# Patient Record
Sex: Female | Born: 1940 | Race: Black or African American | Hispanic: No | Smoking: Never smoker
Health system: Southern US, Community
[De-identification: ages and names within clinical notes are randomized; demographics above are authoritative.]

## PROBLEM LIST (undated history)

## (undated) DIAGNOSIS — T7840XA Allergy, unspecified, initial encounter: Secondary | ICD-10-CM

## (undated) DIAGNOSIS — F419 Anxiety disorder, unspecified: Secondary | ICD-10-CM

## (undated) DIAGNOSIS — N183 Chronic kidney disease, stage 3 unspecified: Secondary | ICD-10-CM

## (undated) DIAGNOSIS — I10 Essential (primary) hypertension: Secondary | ICD-10-CM

## (undated) DIAGNOSIS — R011 Cardiac murmur, unspecified: Secondary | ICD-10-CM

## (undated) DIAGNOSIS — Z86010 Personal history of colonic polyps: Secondary | ICD-10-CM

## (undated) DIAGNOSIS — E7800 Pure hypercholesterolemia, unspecified: Secondary | ICD-10-CM

## (undated) DIAGNOSIS — G709 Myoneural disorder, unspecified: Secondary | ICD-10-CM

## (undated) DIAGNOSIS — H269 Unspecified cataract: Secondary | ICD-10-CM

## (undated) DIAGNOSIS — M1990 Unspecified osteoarthritis, unspecified site: Secondary | ICD-10-CM

## (undated) HISTORY — PX: COLONOSCOPY: SHX174

## (undated) HISTORY — DX: Cardiac murmur, unspecified: R01.1

## (undated) HISTORY — DX: Anxiety disorder, unspecified: F41.9

## (undated) HISTORY — PX: ABDOMINAL HYSTERECTOMY: SHX81

## (undated) HISTORY — DX: Unspecified osteoarthritis, unspecified site: M19.90

## (undated) HISTORY — DX: Chronic kidney disease, stage 3 unspecified: N18.30

## (undated) HISTORY — DX: Personal history of colonic polyps: Z86.010

## (undated) HISTORY — DX: Chronic kidney disease, stage 3 (moderate): N18.3

## (undated) HISTORY — DX: Unspecified cataract: H26.9

## (undated) HISTORY — DX: Myoneural disorder, unspecified: G70.9

## (undated) HISTORY — DX: Allergy, unspecified, initial encounter: T78.40XA

---

## 2000-05-06 ENCOUNTER — Other Ambulatory Visit: Admission: RE | Admit: 2000-05-06 | Discharge: 2000-05-06 | Payer: Self-pay | Admitting: Obstetrics and Gynecology

## 2006-09-01 ENCOUNTER — Ambulatory Visit: Payer: Self-pay | Admitting: Internal Medicine

## 2006-09-16 ENCOUNTER — Ambulatory Visit: Payer: Self-pay | Admitting: Internal Medicine

## 2008-09-01 ENCOUNTER — Ambulatory Visit: Payer: Self-pay | Admitting: Family Medicine

## 2008-09-01 DIAGNOSIS — D230 Other benign neoplasm of skin of lip: Secondary | ICD-10-CM

## 2008-09-01 DIAGNOSIS — M810 Age-related osteoporosis without current pathological fracture: Secondary | ICD-10-CM | POA: Insufficient documentation

## 2008-09-01 DIAGNOSIS — I10 Essential (primary) hypertension: Secondary | ICD-10-CM | POA: Insufficient documentation

## 2008-09-01 DIAGNOSIS — E785 Hyperlipidemia, unspecified: Secondary | ICD-10-CM

## 2008-09-02 ENCOUNTER — Encounter: Payer: Self-pay | Admitting: Family Medicine

## 2008-09-05 LAB — CONVERTED CEMR LAB
Total CHOL/HDL Ratio: 5
Triglycerides: 222 mg/dL — ABNORMAL HIGH (ref <150)

## 2008-09-19 ENCOUNTER — Encounter: Payer: Self-pay | Admitting: Family Medicine

## 2008-09-20 ENCOUNTER — Telehealth: Payer: Self-pay | Admitting: Family Medicine

## 2008-09-29 ENCOUNTER — Ambulatory Visit: Payer: Self-pay | Admitting: Family Medicine

## 2008-09-29 ENCOUNTER — Encounter: Payer: Self-pay | Admitting: Family Medicine

## 2008-09-29 ENCOUNTER — Other Ambulatory Visit: Admission: RE | Admit: 2008-09-29 | Discharge: 2008-09-29 | Payer: Self-pay | Admitting: Family Medicine

## 2008-11-04 ENCOUNTER — Encounter: Payer: Self-pay | Admitting: Family Medicine

## 2008-11-07 ENCOUNTER — Encounter: Payer: Self-pay | Admitting: Family Medicine

## 2009-01-02 ENCOUNTER — Ambulatory Visit: Payer: Self-pay | Admitting: Family Medicine

## 2009-01-05 LAB — CONVERTED CEMR LAB
AST: 25 units/L (ref 0–37)
BUN: 14 mg/dL (ref 6–23)
CO2: 21 meq/L (ref 19–32)
Calcium: 9.7 mg/dL (ref 8.4–10.5)
Cholesterol: 250 mg/dL — ABNORMAL HIGH (ref 0–200)
Creatinine, Ser: 1.32 mg/dL — ABNORMAL HIGH (ref 0.40–1.20)
LDL Cholesterol: 160 mg/dL — ABNORMAL HIGH (ref 0–99)
Potassium: 4.2 meq/L (ref 3.5–5.3)
Sodium: 143 meq/L (ref 135–145)
Total CHOL/HDL Ratio: 4.1
Triglycerides: 143 mg/dL (ref <150)
Vit D, 25-Hydroxy: 52 ng/mL (ref 30–89)

## 2009-01-06 ENCOUNTER — Telehealth: Payer: Self-pay | Admitting: Family Medicine

## 2009-01-12 ENCOUNTER — Encounter: Payer: Self-pay | Admitting: Family Medicine

## 2009-03-22 ENCOUNTER — Ambulatory Visit: Payer: Self-pay | Admitting: Family Medicine

## 2009-03-22 DIAGNOSIS — N183 Chronic kidney disease, stage 3 (moderate): Secondary | ICD-10-CM

## 2009-03-22 DIAGNOSIS — L80 Vitiligo: Secondary | ICD-10-CM | POA: Insufficient documentation

## 2009-03-22 DIAGNOSIS — R7301 Impaired fasting glucose: Secondary | ICD-10-CM | POA: Insufficient documentation

## 2009-03-23 LAB — CONVERTED CEMR LAB
BUN: 14 mg/dL (ref 6–23)
Direct LDL: 162 mg/dL — ABNORMAL HIGH
Ferritin: 186 ng/mL (ref 10–291)
Glucose, Bld: 105 mg/dL — ABNORMAL HIGH (ref 70–99)
Iron: 110 ug/dL (ref 42–145)
TIBC: 429 ug/dL (ref 250–470)

## 2009-04-25 ENCOUNTER — Telehealth: Payer: Self-pay | Admitting: Family Medicine

## 2009-07-06 ENCOUNTER — Ambulatory Visit: Payer: Self-pay | Admitting: Family Medicine

## 2009-07-06 DIAGNOSIS — J069 Acute upper respiratory infection, unspecified: Secondary | ICD-10-CM | POA: Insufficient documentation

## 2009-08-25 ENCOUNTER — Telehealth: Payer: Self-pay | Admitting: Family Medicine

## 2009-10-02 ENCOUNTER — Ambulatory Visit: Payer: Self-pay | Admitting: Family Medicine

## 2009-10-02 DIAGNOSIS — R197 Diarrhea, unspecified: Secondary | ICD-10-CM

## 2009-10-03 LAB — CONVERTED CEMR LAB
ALT: 18 units/L (ref 0–35)
AST: 18 units/L (ref 0–37)
BUN: 12 mg/dL (ref 6–23)
Basophils Absolute: 0 10*3/uL (ref 0.0–0.1)
Eosinophils Absolute: 0.1 10*3/uL (ref 0.0–0.7)
LDL Cholesterol: 140 mg/dL — ABNORMAL HIGH (ref 0–99)
Lymphs Abs: 3.2 10*3/uL (ref 0.7–4.0)
Monocytes Absolute: 0.7 10*3/uL (ref 0.1–1.0)
Monocytes Relative: 9 % (ref 3–12)
Neutro Abs: 3.4 10*3/uL (ref 1.7–7.7)
Neutrophils Relative %: 46 % (ref 43–77)
Potassium: 3.8 meq/L (ref 3.5–5.3)
WBC: 7.5 10*3/uL (ref 4.0–10.5)

## 2009-10-21 HISTORY — PX: BREAST BIOPSY: SHX20

## 2009-12-01 ENCOUNTER — Telehealth: Payer: Self-pay | Admitting: Family Medicine

## 2009-12-06 ENCOUNTER — Encounter: Payer: Self-pay | Admitting: Family Medicine

## 2009-12-13 ENCOUNTER — Encounter: Payer: Self-pay | Admitting: Family Medicine

## 2009-12-20 ENCOUNTER — Encounter: Payer: Self-pay | Admitting: Family Medicine

## 2009-12-21 ENCOUNTER — Ambulatory Visit: Payer: Self-pay | Admitting: Family Medicine

## 2009-12-21 DIAGNOSIS — L03317 Cellulitis of buttock: Secondary | ICD-10-CM

## 2009-12-21 DIAGNOSIS — L0231 Cutaneous abscess of buttock: Secondary | ICD-10-CM

## 2010-01-01 ENCOUNTER — Encounter: Payer: Self-pay | Admitting: Family Medicine

## 2010-01-23 ENCOUNTER — Encounter: Payer: Self-pay | Admitting: Family Medicine

## 2010-01-24 ENCOUNTER — Ambulatory Visit (HOSPITAL_BASED_OUTPATIENT_CLINIC_OR_DEPARTMENT_OTHER): Admission: RE | Admit: 2010-01-24 | Discharge: 2010-01-24 | Payer: Self-pay | Admitting: General Surgery

## 2010-02-13 ENCOUNTER — Encounter: Payer: Self-pay | Admitting: Family Medicine

## 2010-02-19 ENCOUNTER — Telehealth: Payer: Self-pay | Admitting: Family Medicine

## 2010-03-09 ENCOUNTER — Telehealth: Payer: Self-pay | Admitting: Family Medicine

## 2010-10-19 ENCOUNTER — Telehealth (INDEPENDENT_AMBULATORY_CARE_PROVIDER_SITE_OTHER): Payer: Self-pay | Admitting: *Deleted

## 2010-11-11 ENCOUNTER — Encounter: Payer: Self-pay | Admitting: Family Medicine

## 2010-11-20 NOTE — Progress Notes (Signed)
Summary: Meds  Phone Note Call from Patient   Caller: Patient Summary of Call: Pt LMOM stating she would like to dc actonel and restart aldronate sodium. Please advise.  Initial call taken by: Payton Spark CMA,  December 01, 2009 4:29 PM  Follow-up for Phone Call        alendronate is listed as an allergy that it causes hives. Follow-up by: Seymour Bars DO,  December 01, 2009 4:53 PM  Additional Follow-up for Phone Call Additional follow up Details #1::        Correction- Pt would like to restart the actonel. Would like 3 month supply sent to right [facility]. Additional Follow-up by: Payton Spark CMA,  December 01, 2009 4:58 PM    New/Updated Medications: ACTONEL 150 MG TABS (RISEDRONATE SODIUM) 1 tab by mouth once a month Prescriptions: ACTONEL 150 MG TABS (RISEDRONATE SODIUM) 1 tab by mouth once a month  #1 x 6   Entered and Authorized by:   Seymour Bars DO   Signed by:   Seymour Bars DO on 12/02/2009   Method used:   [facility]ron[facility] to        [facility]  [address] [phone]* (retail)       [address]       [place], [place]  [address]       Ph: [phone] or [phone]       Fax: [phone]   RxID:   [id number]

## 2010-11-20 NOTE — Op Note (Signed)
Summary: Excisional Biopsy/[facility]  Excisional Biopsy/[facility]   Imported By: Lanelle Bal 02/13/2010 13:51:05  _____________________________________________________________________  External Attachment:    Type:   Image     Comment:   External Document

## 2010-11-20 NOTE — Letter (Signed)
Summary: [facility]   Imported By: Lanelle Bal 03/15/2010 13:02:27  _____________________________________________________________________  External Attachment:    Type:   Image     Comment:   External Document

## 2010-11-20 NOTE — Progress Notes (Signed)
Summary: [facility]  Phone Note Call from Patient   Caller: Patient Summary of Call: Pt states pharmacists suggested replacing fenofibrate w/ genifibrazile and also replacing Lotrel w/ lisinopril so Pt can save more money. Please advise. Initial call taken by: Payton Spark CMA,  Mar 09, 2010 4:21 PM  Follow-up for Phone Call        Gemfibrozil is not the same thing as fenofibrate.  It is not as safe to use. Lisinopril is not the same as lotrel which is a combo drug. Follow-up by: Seymour Bars DO,  Mar 09, 2010 4:37 PM     Appended Document: [facility] Pt aware

## 2010-11-20 NOTE — Progress Notes (Signed)
Summary: Requests generic  Phone Note Call from Patient   Caller: Patient Summary of Call: Pt requests generic = to AZOR and ZETIA. She can no longer afford meds.Please advise. Initial call taken by: Payton Spark CMA,  Feb 19, 2010 1:50 PM  Follow-up for Phone Call        Lotrel replaces AZOR. Lovastatin replaces Zetia.  Call if any problems.   Return to f/u BP and chol in 6 wks. Follow-up by: Seymour Bars DO,  Feb 19, 2010 3:48 PM    New/Updated Medications: LOTREL 10-20 MG CAPS (AMLODIPINE BESY-BENAZEPRIL HCL) 1 tab by mouth daily LOVASTATIN 20 MG TABS (LOVASTATIN) 1 tab by mouth qhs Prescriptions: LOVASTATIN 20 MG TABS (LOVASTATIN) 1 tab by mouth qhs  #30 x 2   Entered and Authorized by:   Seymour Bars DO   Signed by:   Seymour Bars DO on 02/19/2010   Method used:   [facility]        [facility]  [address] [phone]* (retail)       [address]       [place], [place]  [address]       Ph: [phone] or [phone]       Fax: [phone]   RxID:   [id number] LOTREL 10-20 MG CAPS (AMLODIPINE BESY-BENAZEPRIL HCL) 1 tab by mouth daily  #30 x 2   Entered and Authorized by:   Seymour Bars DO   Signed by:   Seymour Bars DO on 02/19/2010   Method used:   [facility]        [facility]  [address] [phone]* (retail)       [address]       [place], [place]  [address]       Ph: [phone] or [phone]       Fax: [phone]   RxID:   [id number]   Appended Document: Requests generic LMOM informing Pt of the above  Appended Document: Requests generic Pt stopped stating that she has tried statin drugs in the past and the cause muscle aches and her to feel aweful. Pt would like another Rx that is not a statin and that will be affordable. Please advise.[name] CMA, Michelle Feb 22, 2010 2:44 PM   I changed her cholesterol medicine to Fenofibrate which is NOT a statin.  Let's repeat FLP and LFTs in 3 mos.  Call if any  problems.  Seymour Bars, D.O.  Appended Document: Requests generic LMOM informing Pt of the above

## 2010-11-20 NOTE — Letter (Signed)
Summary: [facility]   Imported By: Lanelle Bal 01/18/2010 09:03:18  _____________________________________________________________________  External Attachment:    Type:   Image     Comment:   External Document

## 2010-11-20 NOTE — Assessment & Plan Note (Signed)
Summary: abscess   Vital Signs:  Patient profile:   70 year old female Height:      61.5 inches Weight:      138 pounds BMI:     25.75 O2 Sat:      98 % on Room air Temp:     98.2 degrees F oral Pulse rate:   59 / minute BP sitting:   127 / 76  (left arm) Cuff size:   regular  Vitals Entered By: Payton Spark CMA (December 21, 2009 1:46 PM)  O2 Flow:  Room air CC: ? boil inside L buttock x 8 days. No pain. No drainage.    Primary Care Provider:  Seymour Bars DO  CC:  ? boil inside L buttock x 8 days. No pain. No drainage. Marland Kitchen  History of Present Illness: 70 yo AAF presents for a boil on the inside of her L buttock that started 8 days ago.  No fevers.  Minimal tenderness. No bleeding.  Has not felt any drainage.  O/w feels well.  Has some itching.  Current Medications (verified): 1)  Azor 5-20 Mg Tabs (Amlodipine-Olmesartan) .Marland Kitchen.. 1 Tab By Mouth Daily 2)  Multivitamins  Tabs (Multiple Vitamin) .... Take 1 Tablet By Mouth Once A Day 3)  Caltrate 600+d 600-400 Mg-Unit Tabs (Calcium Carbonate-Vitamin D) .... Take 1 Tablet By Mouth Two Times A Day 4)  Zetia 10 Mg Tabs (Ezetimibe) .Marland Kitchen.. 1 Tab By Mouth Daily 5)  D 400  Caps (Vitamins A & D) .... Take 1 Tablet By Mouth Once A Day 6)  Actonel 150 Mg Tabs (Risedronate Sodium) .Marland Kitchen.. 1 Tab By Mouth Once A Month  Allergies (verified): 1)  ! Calan 2)  ! Alendronate Sodium (Alendronate Sodium) 3)  Lovaza (Omega-3-Acid Ethyl Esters)  Past History:  Past Medical History: Reviewed history from 03/22/2009 and no changes required. high cholesterol surgical menopause at 36, was on HRT x 20 yrs. HTN osteopenia (DEXA 2007) G1P1 IFG HTN  Social History: Reviewed history from 09/01/2008 and no changes required. Retired Engineer, civil (consulting). Divorced.  Not in a relationship. Lives alone. Never smoked.  Denies ETOH. Walks/ dances for an hour 3 x a wk. Fair diet.  Has a daughter and 2 grandkids in [place].  Review of Systems      See HPI  Physical  Exam  General:  alert, well-developed, well-nourished, and well-hydrated.   Lungs:  Normal respiratory effort, chest expands symmetrically. Lungs are clear to auscultation, no crackles or wheezes. Heart:  Normal rate and regular rhythm. S1 and S2 normal without gallop, murmur, click, rub or other extra sounds. Skin:  1 cm round open denuded skin in the L natal cleft with clear serosanguinous drainage.  No pus.  No induration.  Erythematous base with no streaking or heat or edema.     Impression & Recommendations:  Problem # 1:  CELLULITIS AND ABSCESS OF BUTTOCK (ICD-682.5) Small open natal cleft abscess.  Treat with Doxy x 7 days. Clean with Dial soap and water and cover with polysporin ointment.  Call if not improving. Her updated medication list for this problem includes:    Doxycycline Hyclate 100 Mg Caps (Doxycycline hyclate) .Marland Kitchen... 1 capsule by mouth two times a day x 7 days  Complete Medication List: 1)  Azor 5-20 Mg Tabs (Amlodipine-olmesartan) .Marland Kitchen.. 1 tab by mouth daily 2)  Multivitamins Tabs (Multiple vitamin) .... Take 1 tablet by mouth once a day 3)  Caltrate 600+d 600-400 Mg-unit Tabs (Calcium carbonate-vitamin d) .... Take  1 tablet by mouth two times a day 4)  Zetia 10 Mg Tabs (Ezetimibe) .Marland Kitchen.. 1 tab by mouth daily 5)  D 400 Caps (Vitamins a & d) .... Take 1 tablet by mouth once a day 6)  Actonel 150 Mg Tabs (Risedronate sodium) .Marland Kitchen.. 1 tab by mouth once a month 7)  Doxycycline Hyclate 100 Mg Caps (Doxycycline hyclate) .Marland Kitchen.. 1 capsule by mouth two times a day x 7 days  Patient Instructions: 1)  Treat open abscess on bottom with 7 days of oral doxycycline (2 x a day with food).  2)  Clean with dial soap and water daily and cover with polysporin ointment.   3)  Call if not improved in 10 days. Prescriptions: DOXYCYCLINE HYCLATE 100 MG CAPS (DOXYCYCLINE HYCLATE) 1 capsule by mouth two times a day x 7 days  #14 x 0   Entered and Authorized by:   Seymour Bars DO   Signed by:   Seymour Bars DO on 12/21/2009   Method used:   [facility]        [facility]  [address] [phone]* (retail)       [address]       [place], [place]  [address]       Ph: [phone] or [phone]       Fax: [phone]   RxID:   [id number] ZETIA 10 MG TABS (EZETIMIBE) 1 tab by mouth daily  #90 x 0   Entered by:   Payton Spark CMA   Authorized by:   Seymour Bars DO   Signed by:   Seymour Bars DO on 12/21/2009   Method used:   [facility]        [facility]  [address] [phone]* (retail)       [address]       [place], [place]  [address]       Ph: [phone] or [phone]       Fax: [phone]   RxID:   [id number]

## 2010-11-22 NOTE — Progress Notes (Signed)
       New/Updated Medications: AMLODIPINE BESYLATE 10 MG TABS (AMLODIPINE BESYLATE) Take 1 tab by mouth once daily BENAZEPRIL HCL 20 MG TABS (BENAZEPRIL HCL) Take 1 tab by mouth once daily Prescriptions: BENAZEPRIL HCL 20 MG TABS (BENAZEPRIL HCL) Take 1 tab by mouth once daily  #30 x 3   Entered by:   Payton Spark CMA   Authorized by:   Seymour Bars DO   Signed by:   Payton Spark CMA on 10/19/2010   Method used:   [facility]        [facility]  [address] [phone]* (retail)       [address]       [place], [place]  [address]       Ph: [phone] or [phone]       Fax: [phone]   RxID:   [id number] AMLODIPINE BESYLATE 10 MG TABS (AMLODIPINE BESYLATE) Take 1 tab by mouth once daily  #30 x 3   Entered by:   Payton Spark CMA   Authorized by:   Seymour Bars DO   Signed by:   Payton Spark CMA on 10/19/2010   Method used:   [facility]        [facility]  [address] [phone]* (retail)       [address]       [place], [place]  [address]       Ph: [phone] or [phone]       Fax: [phone]   RxID:   [id number]

## 2011-01-09 LAB — BASIC METABOLIC PANEL
BUN: 12 mg/dL (ref 6–23)
Chloride: 110 mEq/L (ref 96–112)
Creatinine, Ser: 0.89 mg/dL (ref 0.4–1.2)
Glucose, Bld: 137 mg/dL — ABNORMAL HIGH (ref 70–99)
Potassium: 3.8 mEq/L (ref 3.5–5.1)

## 2011-01-09 LAB — DIFFERENTIAL
Basophils Absolute: 0 10*3/uL (ref 0.0–0.1)
Basophils Relative: 0 % (ref 0–1)
Eosinophils Absolute: 0.1 10*3/uL (ref 0.0–0.7)
Eosinophils Relative: 1 % (ref 0–5)
Neutrophils Relative %: 53 % (ref 43–77)

## 2011-01-09 LAB — CBC
HCT: 43.7 % (ref 36.0–46.0)
MCV: 91.1 fL (ref 78.0–100.0)
Platelets: 182 10*3/uL (ref 150–400)
RDW: 13.9 % (ref 11.5–15.5)

## 2011-01-09 LAB — POCT HEMOGLOBIN-HEMACUE: Hemoglobin: 15.4 g/dL — ABNORMAL HIGH (ref 12.0–15.0)

## 2011-02-18 ENCOUNTER — Other Ambulatory Visit: Payer: Self-pay | Admitting: Family Medicine

## 2011-03-05 ENCOUNTER — Other Ambulatory Visit: Payer: Self-pay | Admitting: Family Medicine

## 2012-02-29 ENCOUNTER — Emergency Department (HOSPITAL_BASED_OUTPATIENT_CLINIC_OR_DEPARTMENT_OTHER)
Admission: EM | Admit: 2012-02-29 | Discharge: 2012-02-29 | Disposition: A | Discharge status: Home / Self Care | Payer: Medicare Other | Attending: Emergency Medicine | Admitting: Emergency Medicine

## 2012-02-29 ENCOUNTER — Encounter (HOSPITAL_BASED_OUTPATIENT_CLINIC_OR_DEPARTMENT_OTHER): Payer: Self-pay | Admitting: *Deleted

## 2012-02-29 DIAGNOSIS — R197 Diarrhea, unspecified: Secondary | ICD-10-CM | POA: Insufficient documentation

## 2012-02-29 DIAGNOSIS — Z79899 Other long term (current) drug therapy: Secondary | ICD-10-CM | POA: Insufficient documentation

## 2012-02-29 DIAGNOSIS — I10 Essential (primary) hypertension: Secondary | ICD-10-CM | POA: Insufficient documentation

## 2012-02-29 DIAGNOSIS — E7800 Pure hypercholesterolemia, unspecified: Secondary | ICD-10-CM | POA: Insufficient documentation

## 2012-02-29 DIAGNOSIS — M810 Age-related osteoporosis without current pathological fracture: Secondary | ICD-10-CM | POA: Insufficient documentation

## 2012-02-29 DIAGNOSIS — R109 Unspecified abdominal pain: Secondary | ICD-10-CM | POA: Insufficient documentation

## 2012-02-29 HISTORY — DX: Pure hypercholesterolemia, unspecified: E78.00

## 2012-02-29 HISTORY — DX: Essential (primary) hypertension: I10

## 2012-02-29 LAB — CBC
MCH: 30.1 pg (ref 26.0–34.0)
MCHC: 34.6 g/dL (ref 30.0–36.0)
MCV: 86.8 fL (ref 78.0–100.0)
Platelets: 178 10*3/uL (ref 150–400)
RBC: 4.79 MIL/uL (ref 3.87–5.11)
RDW: 13.4 % (ref 11.5–15.5)

## 2012-02-29 LAB — URINALYSIS, ROUTINE W REFLEX MICROSCOPIC
Bilirubin Urine: NEGATIVE
Ketones, ur: NEGATIVE mg/dL
Nitrite: NEGATIVE
Protein, ur: NEGATIVE mg/dL
Urobilinogen, UA: 0.2 mg/dL (ref 0.0–1.0)

## 2012-02-29 LAB — DIFFERENTIAL
Eosinophils Absolute: 0.1 10*3/uL (ref 0.0–0.7)
Eosinophils Relative: 1 % (ref 0–5)
Lymphs Abs: 3.8 10*3/uL (ref 0.7–4.0)

## 2012-02-29 LAB — COMPREHENSIVE METABOLIC PANEL
ALT: 28 U/L (ref 0–35)
BUN: 10 mg/dL (ref 6–23)
Calcium: 9.7 mg/dL (ref 8.4–10.5)
GFR calc Af Amer: 74 mL/min — ABNORMAL LOW (ref >90)
Glucose, Bld: 103 mg/dL — ABNORMAL HIGH (ref 70–99)
Sodium: 139 mEq/L (ref 135–145)
Total Protein: 7.4 g/dL (ref 6.0–8.3)

## 2012-02-29 LAB — LIPASE, BLOOD: Lipase: 22 U/L (ref 11–59)

## 2012-02-29 MED ORDER — CIPROFLOXACIN HCL 500 MG PO TABS
500.0000 mg | ORAL_TABLET | Freq: Once | ORAL | Status: AC
  Administered 2012-02-29: 500 mg via ORAL
  Filled 2012-02-29: qty 1

## 2012-02-29 MED ORDER — METRONIDAZOLE 500 MG PO TABS
500.0000 mg | ORAL_TABLET | Freq: Once | ORAL | Status: AC
  Administered 2012-02-29: 500 mg via ORAL
  Filled 2012-02-29: qty 1

## 2012-02-29 MED ORDER — ONDANSETRON HCL 4 MG/2ML IJ SOLN
4.0000 mg | Freq: Once | INTRAMUSCULAR | Status: AC
  Administered 2012-02-29: 4 mg via INTRAVENOUS
  Filled 2012-02-29: qty 2

## 2012-02-29 MED ORDER — METRONIDAZOLE 500 MG PO TABS: 500.0000 mg | ORAL_TABLET | Freq: Two times a day (BID) | ORAL | Status: AC

## 2012-02-29 MED ORDER — SODIUM CHLORIDE 0.9 % IV BOLUS (SEPSIS)
1000.0000 mL | Freq: Once | INTRAVENOUS | Status: AC
  Administered 2012-02-29: 1000 mL via INTRAVENOUS

## 2012-02-29 MED ORDER — CIPROFLOXACIN HCL 500 MG PO TABS: 500.0000 mg | ORAL_TABLET | Freq: Two times a day (BID) | ORAL | Status: AC

## 2012-02-29 NOTE — ED Provider Notes (Signed)
History   This chart was scribed for Linda Quarry, MD by Shari Heritage. The patient was seen in room MH12/MH12. Patient's care was started at 1343.   CSN: [id number]  Arrival date & time 02/29/12  1343   First MD Initiated Contact with Patient 02/29/12 [phone]      Chief Complaint  Patient presents with  . Rectal Bleeding    (Consider location/radiation/quality/duration/timing/severity/associated sxs/prior treatment) The history is provided by the patient. No language interpreter was used.   Linda Werner is a 71 y.o. femalea who presents to the Emergency Department complaining of persistent hematochezia onset yesterday evening associated with lower abdominal cramps, nausea, diarrhea, and vomiting. Patient reports having 6 bowel movements. Patient describes the blood as bright red. Patient reports that there was also a mucous-like substance in her stool. Patient claims she had one episode of emesis. Patient experienced abdominal cramps all afternoon yesterday and felt nauseated all last night. Patient reports that she feels uncomfortable, but she is not currently in pain. Patient reports that she ate oatmeal earlier today and can keep fluids down. Patient denies history of diverticulitis or stomach bleeding. Patien't doesn't take Aspirin regularly. Patient denies chills, HA, chest pain, back pain, cough and congestion. Patient with h/o of HTN, hypercholesteremia, and osteoporosis.  PCP - Dr. Glory Rosebush at [facility] Past Medical History  Diagnosis Date  . Hypertension   . Hypercholesteremia   . Osteoporosis     Past Surgical History  Procedure Date  . Abdominal hysterectomy     History reviewed. No pertinent family history.  History  Substance Use Topics  . Smoking status: Never Smoker   . Smokeless tobacco: Not on file  . Alcohol Use: No    OB History    Grav Para Term Preterm Abortions TAB SAB Ect Mult Living                  Review of Systems  Gastrointestinal:  Positive for nausea, vomiting, abdominal pain (Describes pain as cramps), diarrhea (6 bowel movements) and blood in stool.  All other systems reviewed and are negative.     Allergies  Alendronate sodium; Statins; and Verapamil hcl  Home Medications   Current Outpatient Rx  Name Route Sig Dispense Refill  . CALCIUM CARBONATE 600 MG PO TABS Oral Take 600 mg by mouth 2 (two) times daily with a meal.    . EZETIMIBE 10 MG PO TABS Oral Take 10 mg by mouth daily.    . CENTRUM SILVER PO Oral Take 1 tablet by mouth once.    Marland Kitchen RISEDRONATE SODIUM 150 MG PO TABS Oral Take 150 mg by mouth every 30 (thirty) days. with water on empty stomach, nothing by mouth or lie down for next 30 minutes.    Marland Kitchen VITAMIN D (CHOLECALCIFEROL) 400 UNITS PO CHEW Oral Chew by mouth.    . AMLODIPINE BESYLATE 10 MG PO TABS  TAKE 1 TAB BY MOUTH ONCE DAILY 30 tablet 3  . BENAZEPRIL HCL 20 MG PO TABS  TAKE 1 TAB BY MOUTH ONCE DAILY 30 tablet 3    BP 137/83  Pulse 100  Temp(Src) 98.5 F (36.9 C) (Oral)  Resp 20  Ht 5\' 2"  (1.575 m)  Wt 142 lb (64.411 kg)  BMI 25.97 kg/m2  SpO2 98%  Physical Exam  Nursing note and vitals reviewed. Constitutional: She is oriented to person, place, and time. She appears well-developed and well-nourished.  HENT:  Head: Normocephalic and atraumatic.  Eyes: Conjunctivae and EOM are  normal. Pupils are equal, round, and reactive to light.  Neck: Normal range of motion. Neck supple.  Cardiovascular: Normal rate and regular rhythm.   Pulmonary/Chest: Effort normal and breath sounds normal.  Abdominal: Soft. Bowel sounds are normal.       Reddish mucous in stool  Musculoskeletal: Normal range of motion.  Neurological: She is alert and oriented to person, place, and time.  Skin: Skin is warm and dry.  Psychiatric: She has a normal mood and affect.    ED Course  Procedures (including critical care time) DIAGNOSTIC STUDIES: Oxygen Saturation is 98% on room air, normal by my  interpretation.    COORDINATION OF CARE: 3:21PM- Patient informed of current plan for treatment and evaluation and agrees with plan at this time. Will order labs including CBC.     Labs Reviewed  URINALYSIS, ROUTINE W REFLEX MICROSCOPIC  OCCULT BLOOD X 1 CARD TO LAB, STOOL   No results found.   No diagnosis found.    MDM  Patient with symptoms c.w. Gastroenteritis- No fever here and no abdominal pain.  Patient with stool with mucous and blood.  Plan cipro and flagyl and advised follow up with md on Monday or return if symptoms worsen.     Linda Quarry, MD 02/29/12 [phone]

## 2012-02-29 NOTE — ED Notes (Signed)
Pt states this has happened a couple of times before, but last night she noticed bright red blood in mucousy stool. Nrl colonoscopy 5 yrs ago. "Uncomfortable feeling in abd"

## 2012-02-29 NOTE — Discharge Instructions (Signed)
Bloody Diarrhea Bloody diarrhea can be caused by many different conditions. Most of the time bloody diarrhea is the result of food poisoning or minor infections. Bloody diarrhea usually improves over 2 to 3 days of rest and fluid replacement. Other conditions that can cause bloody diarrhea include:  Internal bleeding.   Infection.   Diseases of the bowel and colon.  Internal bleeding from an ulcer or bowel disease can be severe and requires hospital care or even surgery. DIAGNOSIS  To find out what is wrong your caregiver may check your:  Stool.   Blood.   Results from a test that looks inside the body (endoscopy).  TREATMENT   Get plenty of rest.   Drink enough water and fluids to keep your urine clear or pale yellow.   Do not smoke.   Solid foods and dairy products should be avoided until your illness improves.   As you improve, slowly return to a regular diet with easily-digested foods first. Examples are:   Bananas.   Rice.   Toast.   Crackers.  You should only need these for about 2 days before adding more normal foods to your diet.  Avoid spicy or fatty foods as well as caffeine and alcohol for several days.   Medicine to control cramping and diarrhea can relieve symptoms but may prolong some cases of bloody diarrhea. Antibiotics can speed recovery from diarrhea due to some bacterial infections. Call your caregiver if diarrhea does not get better in 3 days.  SEEK MEDICAL CARE IF:   You do not improve after 3 days.   Your diarrhea improves but your stool appears black.  SEEK IMMEDIATE MEDICAL CARE IF:   You become extremely weak or faint.   You become very sweaty.   You have increased pain or bleeding.   You develop repeated vomiting.   You vomit and you see blood or the vomit looks black in color.   You have a fever.  Document Released: 10/07/2005 Document Revised: 09/26/2011 Document Reviewed: 09/08/2009 [facility] Patient Information 2012 [place],  [place].  Recheck with your doctor Monday.  Return if worse especially increased bleeding or pain or weakness.

## 2012-09-02 ENCOUNTER — Encounter: Payer: Self-pay | Admitting: Family Medicine

## 2012-09-02 ENCOUNTER — Ambulatory Visit (INDEPENDENT_AMBULATORY_CARE_PROVIDER_SITE_OTHER): Payer: Medicare Other | Admitting: Family Medicine

## 2012-09-02 VITALS — BP 132/72 | HR 85 | Ht 61.0 in | Wt 148.0 lb

## 2012-09-02 DIAGNOSIS — Z0000 Encounter for general adult medical examination without abnormal findings: Secondary | ICD-10-CM

## 2012-09-02 DIAGNOSIS — M899 Disorder of bone, unspecified: Secondary | ICD-10-CM

## 2012-09-02 DIAGNOSIS — Z23 Encounter for immunization: Secondary | ICD-10-CM

## 2012-09-02 DIAGNOSIS — H259 Unspecified age-related cataract: Secondary | ICD-10-CM

## 2012-09-02 DIAGNOSIS — M8580 Other specified disorders of bone density and structure, unspecified site: Secondary | ICD-10-CM

## 2012-09-02 DIAGNOSIS — E785 Hyperlipidemia, unspecified: Secondary | ICD-10-CM

## 2012-09-02 DIAGNOSIS — Z1231 Encounter for screening mammogram for malignant neoplasm of breast: Secondary | ICD-10-CM

## 2012-09-02 LAB — COMPLETE METABOLIC PANEL WITH GFR
ALT: 22 U/L (ref 0–35)
AST: 24 U/L (ref 0–37)
Albumin: 4.8 g/dL (ref 3.5–5.2)
Calcium: 10.3 mg/dL (ref 8.4–10.5)
Chloride: 104 mEq/L (ref 96–112)
Potassium: 3.6 mEq/L (ref 3.5–5.3)
Total Protein: 7.2 g/dL (ref 6.0–8.3)

## 2012-09-02 LAB — LIPID PANEL
LDL Cholesterol: 154 mg/dL — ABNORMAL HIGH (ref 0–99)
VLDL: 39 mg/dL (ref 0–40)

## 2012-09-02 MED ORDER — RISEDRONATE SODIUM 150 MG PO TABS: 150.0000 mg | ORAL_TABLET | ORAL | Status: DC

## 2012-09-02 MED ORDER — EZETIMIBE 10 MG PO TABS: 10.0000 mg | ORAL_TABLET | Freq: Every day | ORAL | Status: DC

## 2012-09-02 NOTE — Patient Instructions (Signed)
Keep up a regular exercise program and make sure you are eating a healthy diet Try to eat 4 servings of dairy a day, or if you are lactose intolerant take a calcium with vitamin D daily.  Your vaccines are up to date.   

## 2012-09-02 NOTE — Addendum Note (Signed)
Addended by: Judie Petit A on: 09/02/2012 10:13 AM   Modules accepted: Orders

## 2012-09-02 NOTE — Progress Notes (Signed)
Subjective:    Linda Werner is a 71 y.o. female who presents for Medicare Annual/Subsequent preventive examination.  Preventive Screening-Counseling & Management  Tobacco History  Smoking status  . Never Smoker   Smokeless tobacco  . Not on file     Problems Prior to Visit 1.   Current Problems (verified) Patient Active Problem List  Diagnosis  . BENIGN NEOPLASM OF SKIN OF LIP  . HYPERLIPIDEMIA  . ESSENTIAL HYPERTENSION, BENIGN  . URI  . RENAL DISEASE, CHRONIC, MILD  . CELLULITIS AND ABSCESS OF BUTTOCK  . VITILIGO  . OSTEOPENIA  . DIARRHEA  . IMPAIRED FASTING GLUCOSE    Medications Prior to Visit Current Outpatient Prescriptions on File Prior to Visit  Medication Sig Dispense Refill  . amLODipine (NORVASC) 10 MG tablet TAKE 1 TAB BY MOUTH ONCE DAILY  30 tablet  3  . benazepril (LOTENSIN) 20 MG tablet TAKE 1 TAB BY MOUTH ONCE DAILY  30 tablet  3  . calcium carbonate (OS-CAL) 600 MG TABS Take 600 mg by mouth 2 (two) times daily with a meal.      . ezetimibe (ZETIA) 10 MG tablet Take 10 mg by mouth daily.      . Multiple Vitamins-Minerals (CENTRUM SILVER PO) Take 1 tablet by mouth once.      . risedronate (ACTONEL) 150 MG tablet Take 150 mg by mouth every 30 (thirty) days. with water on empty stomach, nothing by mouth or lie down for next 30 minutes.      . Vitamin D, Cholecalciferol, 400 UNITS CHEW Chew by mouth.        Current Medications (verified) Current Outpatient Prescriptions  Medication Sig Dispense Refill  . amLODipine (NORVASC) 10 MG tablet TAKE 1 TAB BY MOUTH ONCE DAILY  30 tablet  3  . benazepril (LOTENSIN) 20 MG tablet TAKE 1 TAB BY MOUTH ONCE DAILY  30 tablet  3  . calcium carbonate (OS-CAL) 600 MG TABS Take 600 mg by mouth 2 (two) times daily with a meal.      . ezetimibe (ZETIA) 10 MG tablet Take 10 mg by mouth daily.      . Multiple Vitamins-Minerals (CENTRUM SILVER PO) Take 1 tablet by mouth once.      . risedronate (ACTONEL) 150 MG tablet  Take 150 mg by mouth every 30 (thirty) days. with water on empty stomach, nothing by mouth or lie down for next 30 minutes.      . Vitamin D, Cholecalciferol, 400 UNITS CHEW Chew by mouth.         Allergies (verified) Alendronate sodium; Statins; and Verapamil hcl   PAST HISTORY  Family History Family History  Problem Relation Age of Onset  . Hypertension      Social History History  Substance Use Topics  . Smoking status: Never Smoker   . Smokeless tobacco: Not on file  . Alcohol Use: No     Are there smokers in your home (other than you)? No  Risk Factors Current exercise habits: daily exercise  Dietary issues discussed: none   Cardiac risk factors: none.  Depression Screen (Note: if answer to either of the following is "Yes", a more complete depression screening is indicated)   Over the past two weeks, have you felt down, depressed or hopeless? No  Over the past two weeks, have you felt little interest or pleasure in doing things? No  Have you lost interest or pleasure in daily life? No  Do you often feel hopeless? No  Do you cry easily over simple problems? No  Activities of Daily Living In your present state of health, do you have any difficulty performing the following activities?:  Driving? No Managing money?  No Feeding yourself? No Getting from bed to chair? No Climbing a flight of stairs? No Preparing food and eating?: No Bathing or showering? No Getting dressed: No Getting to the toilet? No Using the toilet:No Moving around from place to place: No In the past year have you fallen or had a near fall?:No   Are you sexually active?  No  Do you have more than one partner?  No  Hearing Difficulties: No Do you often ask people to speak up or repeat themselves? No Do you experience ringing or noises in your ears? No Do you have difficulty understanding soft or whispered voices? No   Do you feel that you have a problem with memory? No  Do you often  misplace items? No  Do you feel safe at home?  No  Cognitive Testing  Alert? Yes  Normal Appearance?Yes  Oriented to person? Yes  Place? Yes   Time? Yes  Recall of three objects?  Yes  Can perform simple calculations? Yes  Displays appropriate judgment?Yes  Can read the correct time from a watch face?Yes   Advanced Directives have been discussed with the patient? No  List the Names of Other Physician/Practitioners you currently use: 1.    Indicate any recent Medical Services you may have received from other than [facility] providers in the past year (date may be approximate).  Immunization History  Administered Date(s) Administered  . Influenza Whole 09/01/2008  . Pneumococcal-Generic 10/21/2008    Screening Tests Health Maintenance  Topic Date Due  . Tetanus/tdap  12/18/1961  . Colonoscopy  12/18/1992  . Pneumococcal Polysaccharide Vaccine Age 32 And Over  12/19/2007  . Influenza Vaccine  06/21/2012  . Mammogram  02/18/2013  . Zostavax  12/18/2002    All answers were reviewed with the patient and necessary referrals were made:  METHENEY,CATHERINE, MD   09/02/2012   History reviewed: allergies, current medications, past family history, past medical history, past social history, past surgical history and problem list  Review of Systems A comprehensive review of systems was negative.    Objective:     Vision by Snellen chart: right eye:20/20, left eye:20/20  Body mass index is 27.96 kg/(m^2). BP 132/72  Pulse 85  Ht 5\' 1"  (1.549 m)  Wt 148 lb (67.132 kg)  BMI 27.96 kg/m2  BP 132/72  Pulse 85  Ht 5\' 1"  (1.549 m)  Wt 148 lb (67.132 kg)  BMI 27.96 kg/m2  General Appearance:    Alert, cooperative, no distress, appears stated age  Head:    Normocephalic, without obvious abnormality, atraumatic  Eyes:    PERRL, conjunctiva/corneas clear, EOM's intact, both eyes  Ears:    Normal TM's and external ear canals, both ears  Nose:   Nares normal, septum midline, mucosa normal,  no drainage    or sinus tenderness  Throat:   Lips, mucosa, and tongue normal; teeth and gums normal  Neck:   Supple, symmetrical, trachea midline, no adenopathy;    thyroid:  no enlargement/tenderness/nodules; no carotid   bruit or JVD  Back:     Symmetric, no curvature, ROM normal, no CVA tenderness  Lungs:     Clear to auscultation bilaterally, respirations unlabored  Chest Wall:    No tenderness or deformity   Heart:    Regular rate  and rhythm, S1 and S2 normal, no murmur, rub   or gallop  Breast Exam:    Not performed.   Abdomen:     Soft, non-tender, bowel sounds active all four quadrants,    no masses, no organomegaly  Genitalia:    Not performed.   Rectal:    Not performed.   Extremities:   Extremities normal, atraumatic, no cyanosis or edema  Pulses:   2+ and symmetric all extremities  Skin:   Skin color, texture, turgor normal, no rashes or lesions  Lymph nodes:   Cervical, supraclavicular, and axillary nodes normal  Neurologic:   CNII-XII intact, normal strength, sensation and reflexes    throughout       Assessment:     Annual Wellness Exam     Plan:     During the course of the visit the patient was educated and counseled about appropriate screening and preventive services including:    Influenza vaccine  Td vaccine  Screening mammography  Bone densitometry screening - has osteopenia.  On alendronate.  Will schedule at [facility] since that is where had the last bone density test.   Will get tdap later, didn't want to do it today.   I did encourage her make an eye exam for her cataracts.  Flu vaccine given today.  Diet review for nutrition referral? Yes ____  Not Indicated __x__   Patient Instructions (the written plan) was given to the patient.  Medicare Attestation I have personally reviewed: The patient's medical and social history Their use of alcohol, tobacco or illicit drugs Their current medications and supplements The patient's  functional ability including ADLs,fall risks, home safety risks, cognitive, and hearing and visual impairment Diet and physical activities Evidence for depression or mood disorders  The patient's weight, height, BMI, and visual acuity have been recorded in the chart.  I have made referrals, counseling, and provided education to the patient based on review of the above and I have provided the patient with a written personalized care plan for preventive services.     METHENEY,CATHERINE, MD   09/02/2012

## 2012-09-15 ENCOUNTER — Other Ambulatory Visit: Payer: Medicare Other

## 2012-09-15 ENCOUNTER — Ambulatory Visit: Payer: Medicare Other

## 2012-09-22 ENCOUNTER — Ambulatory Visit (INDEPENDENT_AMBULATORY_CARE_PROVIDER_SITE_OTHER): Payer: Medicare Other

## 2012-09-22 DIAGNOSIS — Z1231 Encounter for screening mammogram for malignant neoplasm of breast: Secondary | ICD-10-CM

## 2012-11-24 ENCOUNTER — Ambulatory Visit (INDEPENDENT_AMBULATORY_CARE_PROVIDER_SITE_OTHER): Payer: Medicare Other | Admitting: Sports Medicine

## 2012-11-24 ENCOUNTER — Encounter: Payer: Self-pay | Admitting: Sports Medicine

## 2012-11-24 ENCOUNTER — Ambulatory Visit (INDEPENDENT_AMBULATORY_CARE_PROVIDER_SITE_OTHER): Payer: Medicare Other

## 2012-11-24 VITALS — BP 153/94 | HR 99 | Wt 145.0 lb

## 2012-11-24 DIAGNOSIS — S8012XA Contusion of left lower leg, initial encounter: Secondary | ICD-10-CM

## 2012-11-24 DIAGNOSIS — S8010XA Contusion of unspecified lower leg, initial encounter: Secondary | ICD-10-CM

## 2012-11-24 DIAGNOSIS — I10 Essential (primary) hypertension: Secondary | ICD-10-CM

## 2012-11-24 MED ORDER — MELOXICAM 15 MG PO TABS: ORAL_TABLET | ORAL | Status: DC

## 2012-11-24 MED ORDER — AMLODIPINE BESYLATE 10 MG PO TABS: 10.0000 mg | ORAL_TABLET | Freq: Every day | ORAL | Status: DC

## 2012-11-24 MED ORDER — BENAZEPRIL HCL 20 MG PO TABS: 20.0000 mg | ORAL_TABLET | Freq: Every day | ORAL | Status: DC

## 2012-11-24 NOTE — Assessment & Plan Note (Signed)
Refilling amlodipine and Benazepril.

## 2012-11-24 NOTE — Progress Notes (Addendum)
Subjective:    CC: Followup  HPI: Hypertension: Unstable and elevated. Has been out of her medicines for some time now, needs a refill before visit with her PCP.  Recent fall: Occurred while stepping over a room separator gate, impacted her left anterior tibia. Overall pain is improved, but she still has persistent swelling that is tender. Pain is localized, doesn't radiate, is moderate.  Past medical history, Surgical history, Family history not pertinant except as noted below, Social history, Allergies, and medications have been entered into the medical record, reviewed, and no changes needed.   Review of Systems: No fevers, chills, night sweats, weight loss, chest pain, or shortness of breath.   Objective:    General: Well Developed, well nourished, and in no acute distress.  Neuro: Alert and oriented x3, extra-ocular muscles intact, sensation grossly intact.  HEENT: Normocephalic, atraumatic, pupils equal round reactive to light, neck supple, no masses, no lymphadenopathy, thyroid nonpalpable.  Skin: Warm and dry, no rashes. Cardiac: Regular rate and rhythm, no murmurs rubs or gallops.  Respiratory: Clear to auscultation bilaterally. Not using accessory muscles, speaking in full sentences. Left Ankle: There is a small area of fullness that is mildly tender palpation over the distal third of her anterior left tibia. Range of motion is full in all directions. Strength is 5/5 in all directions. Stable lateral and medial ligaments; squeeze test and kleiger test unremarkable; Talar dome nontender; No pain at base of 5th MT; No tenderness over cuboid; No tenderness over N spot or navicular prominence No tenderness on posterior aspects of lateral and medial malleolus No sign of peroneal tendon subluxations or tenderness to palpation Negative tarsal tunnel tinel's Able to walk 4 steps.  Impression and Recommendations:

## 2012-11-24 NOTE — Assessment & Plan Note (Signed)
X-ray of left tib-fib. Mobic for pain. Return as needed.

## 2012-11-27 ENCOUNTER — Telehealth: Payer: Self-pay | Admitting: *Deleted

## 2012-11-27 NOTE — Telephone Encounter (Signed)
Called and lmovm to inform to see if it would be ok to change her Actonel to fosomax due to insurance not covering.Loralee Pacas [place]

## 2012-11-30 NOTE — Telephone Encounter (Signed)
Pt called to inform that her insurance does cover Actonel and that she uses [facility].Linda Werner [place]

## 2012-12-28 ENCOUNTER — Ambulatory Visit (INDEPENDENT_AMBULATORY_CARE_PROVIDER_SITE_OTHER): Payer: Medicare Other | Admitting: Family Medicine

## 2012-12-28 ENCOUNTER — Encounter: Payer: Self-pay | Admitting: Family Medicine

## 2012-12-28 VITALS — BP 118/72 | HR 93 | Ht 61.0 in | Wt 146.0 lb

## 2012-12-28 DIAGNOSIS — M810 Age-related osteoporosis without current pathological fracture: Secondary | ICD-10-CM

## 2012-12-28 DIAGNOSIS — I10 Essential (primary) hypertension: Secondary | ICD-10-CM

## 2012-12-28 DIAGNOSIS — E785 Hyperlipidemia, unspecified: Secondary | ICD-10-CM

## 2012-12-28 DIAGNOSIS — Z23 Encounter for immunization: Secondary | ICD-10-CM

## 2012-12-28 MED ORDER — AMLODIPINE BESYLATE 10 MG PO TABS: 10.0000 mg | ORAL_TABLET | Freq: Every day | ORAL | Status: DC

## 2012-12-28 MED ORDER — EZETIMIBE 10 MG PO TABS: 10.0000 mg | ORAL_TABLET | Freq: Every day | ORAL | Status: DC

## 2012-12-28 MED ORDER — BENAZEPRIL HCL 20 MG PO TABS: 20.0000 mg | ORAL_TABLET | Freq: Every day | ORAL | Status: DC

## 2012-12-28 NOTE — Progress Notes (Signed)
  Subjective:    Patient ID: Linda Werner, female    DOB: [birth year], 72 y.o.   MRN: [id number]  HPI HTN -  Pt denies chest pain, SOB, dizziness, or heart palpitations.  Taking meds as directed w/o problems.  Denies medication side effects.    Osteoporosis - on her bisphosphonate, Calcium and vit D.   Hyperlipidemia-doing well on Zetia. Needs refills today.No S.E.   Review of Systems     Objective:   Physical Exam  Constitutional: She is oriented to person, place, and time. She appears well-developed and well-nourished.  HENT:  Head: Normocephalic and atraumatic.  Cardiovascular: Normal rate, regular rhythm and normal heart sounds.   Pulmonary/Chest: Effort normal and breath sounds normal.  Neurological: She is alert and oriented to person, place, and time.  Skin: Skin is warm and dry.  Psychiatric: She has a normal mood and affect. Her behavior is normal.          Assessment & Plan:  HTN-  Pt denies chest pain, SOB, dizziness, or heart palpitations.  Taking meds as directed w/o problems.  Denies medication side effects.    Oteoporosis - Doing well on current regeimen. Due for dexa later this year.   Hyperlpidemia- doing well. Due to recheck in May.  Given slip today.  Di cussed again the benefit of being on a statin versus unknown statin. They have better morbidity and mortality proven benefit. She says at this point in her life she really wants to just avoid statins because they made her feel so poorly. She is happy with her sodium was like a refill on that today.  Tdap updated today. She declines shingles vaccine.

## 2013-03-02 ENCOUNTER — Ambulatory Visit: Payer: Medicare Other | Admitting: Family Medicine

## 2013-03-02 DIAGNOSIS — Z0289 Encounter for other administrative examinations: Secondary | ICD-10-CM

## 2013-03-29 ENCOUNTER — Ambulatory Visit: Payer: Medicare Other | Admitting: Family Medicine

## 2013-03-30 ENCOUNTER — Encounter: Payer: Self-pay | Admitting: Family Medicine

## 2013-03-30 ENCOUNTER — Ambulatory Visit (INDEPENDENT_AMBULATORY_CARE_PROVIDER_SITE_OTHER): Payer: Medicare Other | Admitting: Family Medicine

## 2013-03-30 VITALS — BP 133/77 | HR 100 | Wt 149.0 lb

## 2013-03-30 DIAGNOSIS — E785 Hyperlipidemia, unspecified: Secondary | ICD-10-CM

## 2013-03-30 DIAGNOSIS — M21969 Unspecified acquired deformity of unspecified lower leg: Secondary | ICD-10-CM

## 2013-03-30 DIAGNOSIS — I10 Essential (primary) hypertension: Secondary | ICD-10-CM

## 2013-03-30 DIAGNOSIS — R7301 Impaired fasting glucose: Secondary | ICD-10-CM

## 2013-03-30 LAB — COMPLETE METABOLIC PANEL WITH GFR
ALT: 27 U/L (ref 0–35)
AST: 27 U/L (ref 0–37)
Albumin: 4.6 g/dL (ref 3.5–5.2)
Alkaline Phosphatase: 97 U/L (ref 39–117)
Potassium: 3.8 mEq/L (ref 3.5–5.3)
Sodium: 139 mEq/L (ref 135–145)
Total Bilirubin: 0.7 mg/dL (ref 0.3–1.2)
Total Protein: 7.4 g/dL (ref 6.0–8.3)

## 2013-03-30 LAB — LIPID PANEL
HDL: 52 mg/dL (ref >39)
LDL Cholesterol: 134 mg/dL — ABNORMAL HIGH (ref 0–99)
Total CHOL/HDL Ratio: 4.3 Ratio
VLDL: 39 mg/dL (ref 0–40)

## 2013-03-30 LAB — POCT GLYCOSYLATED HEMOGLOBIN (HGB A1C): Hemoglobin A1C: 6

## 2013-03-30 MED ORDER — EZETIMIBE 10 MG PO TABS: 10.0000 mg | ORAL_TABLET | Freq: Every day | ORAL | Status: DC

## 2013-03-30 MED ORDER — RISEDRONATE SODIUM 150 MG PO TABS: 150.0000 mg | ORAL_TABLET | ORAL | Status: DC

## 2013-03-30 MED ORDER — AMLODIPINE BESYLATE 10 MG PO TABS: 10.0000 mg | ORAL_TABLET | Freq: Every day | ORAL | Status: DC

## 2013-03-30 MED ORDER — BENAZEPRIL HCL 20 MG PO TABS: 20.0000 mg | ORAL_TABLET | Freq: Every day | ORAL | Status: DC

## 2013-03-30 NOTE — Progress Notes (Signed)
  Subjective:    Patient ID: Linda Werner, female    DOB: [birth year], 72 y.o.   MRN: [id number]  HPI HTN - Pt denies chest pain, SOB, dizziness, or heart palpitations.  Taking meds as directed w/o problems.  Denies medication side effects.    IFG- Has been waking for exercise.  Says doing well with diet.  No inc thrist or urination.   Hyperlipidemia - On zetia. Doing well on this. Doesn't want to take a statin. Does exercise regularly.   Review of Systems     Objective:   Physical Exam  Constitutional: She is oriented to person, place, and time. She appears well-developed and well-nourished.  HENT:  Head: Normocephalic and atraumatic.  Cardiovascular: Normal rate, regular rhythm and normal heart sounds.   No carotid bruits.   Pulmonary/Chest: Effort normal and breath sounds normal.  Musculoskeletal: She exhibits no edema.  Neurological: She is alert and oriented to person, place, and time.  Skin: Skin is warm and dry.  Psychiatric: She has a normal mood and affect. Her behavior is normal.          Assessment & Plan:  HTN - Well controlled.  Med refills sent.  F/U in 6 months.   IFG - Doing well. 6. 0. Stable and well controlled.   Repeat in 6 months.   Hyperlipidemia - Will recheck lipids. On zetia.  Doesn't want statin.

## 2013-06-11 ENCOUNTER — Telehealth: Payer: Self-pay | Admitting: Family Medicine

## 2013-06-11 NOTE — Telephone Encounter (Signed)
Call pt: We received a notification from your health insurance company that you may not be taking some of your medications on a regular basis. We wanted to make sure that you're getting the prescriptions that you need in a timely fashion and that you're not having any problems or difficulty at the pharmacy. If there is anything that we can assist you with, to help you take your medications regularly, then please let us know.  

## 2013-06-11 NOTE — Telephone Encounter (Signed)
Left message for patient to return call.

## 2013-06-23 NOTE — Telephone Encounter (Signed)
Linda Werner  

## 2013-06-24 NOTE — Telephone Encounter (Signed)
Pt stated that there was a time that she couldn't p/u meds due to the pharmacy did not have it in stock. Not having any issues now.Linda Werner [place]

## 2013-09-09 ENCOUNTER — Ambulatory Visit (INDEPENDENT_AMBULATORY_CARE_PROVIDER_SITE_OTHER): Payer: Medicare Other | Admitting: Family Medicine

## 2013-09-09 ENCOUNTER — Encounter: Payer: Self-pay | Admitting: Family Medicine

## 2013-09-09 VITALS — BP 119/73 | HR 87 | Wt 149.0 lb

## 2013-09-09 DIAGNOSIS — R7301 Impaired fasting glucose: Secondary | ICD-10-CM

## 2013-09-09 DIAGNOSIS — Z23 Encounter for immunization: Secondary | ICD-10-CM

## 2013-09-09 DIAGNOSIS — E785 Hyperlipidemia, unspecified: Secondary | ICD-10-CM

## 2013-09-09 DIAGNOSIS — I129 Hypertensive chronic kidney disease with stage 1 through stage 4 chronic kidney disease, or unspecified chronic kidney disease: Secondary | ICD-10-CM

## 2013-09-09 DIAGNOSIS — I10 Essential (primary) hypertension: Secondary | ICD-10-CM

## 2013-09-09 DIAGNOSIS — N183 Chronic kidney disease, stage 3 unspecified: Secondary | ICD-10-CM

## 2013-09-09 MED ORDER — AMLODIPINE BESYLATE 10 MG PO TABS: 10.0000 mg | ORAL_TABLET | Freq: Every day | ORAL | Status: DC

## 2013-09-09 MED ORDER — BENAZEPRIL HCL 20 MG PO TABS: 20.0000 mg | ORAL_TABLET | Freq: Every day | ORAL | Status: DC

## 2013-09-09 NOTE — Progress Notes (Signed)
  Subjective:    Patient ID: Linda Werner, female    DOB: [birth year], 72 y.o.   MRN: [id number]  HPI Has had a slight cough for about a week.  Says it is getting better. Not a lot of nasal congestion.  No fever, chills or sweats. No post nasal drip.  No ear pain.  Started with a slight ST.   HTN-  Pt denies chest pain, SOB, dizziness, or heart palpitations.  Taking meds as directed w/o problems.  Denies medication side effects.    Hyperlipidemia - out of zetia x 2 weeks but too expensive to fill.  Hit medicare gap and can't pay for it.    Osteopenia - has been on bisphosphonate for the last 8 years. Stopped it 6 months ago. Has DEXA schedule next week.   Review of Systems     Objective:   Physical Exam  Constitutional: She is oriented to person, place, and time. She appears well-developed and well-nourished.  HENT:  Head: Normocephalic and atraumatic.  Cardiovascular: Normal rate, regular rhythm and normal heart sounds.   No carotid bruits.   Pulmonary/Chest: Effort normal and breath sounds normal.  Neurological: She is alert and oriented to person, place, and time.  Skin: Skin is warm and dry.  Psychiatric: She has a normal mood and affect. Her behavior is normal.          Assessment & Plan:  Cough - likely viral-she's actually gradually getting better in her lung exam is normal today. Gave reassurance. If she's getting worse or gets fever chills and please call his back. Or if the cough is just not resolving over the next 2 weeks and please let me know. At this point is premature to say if this is an ACE inhibitor induced cough but we'll continue to think about this as a potential diagnosis if she's not improving.  HTN- Well controlled.  Followup in 6 months. Looks fantastic today. Due for BMP.  Hyperlipidemia - OK to stop Zetia. We discussed that study showed a non-statin plus her medications don't necessarily improve outcomes. I think right now it's okay to discontinue  this because of cost. Just make sure working on diet and exercise.  Osteopenia-okay to discontinue bisphosphonate since she's been on one for the last 8 years. She's due for bone density test last week. If it looks stable from previous then we will discontinue bisphosphonate for at least the next 2 years and then repeat her bone density in 2 years.  Impaired fasting glucose-A1c is stable at 5.7 today. Repeat in 6 months.

## 2013-09-20 ENCOUNTER — Encounter: Payer: Self-pay | Admitting: Family Medicine

## 2013-09-20 ENCOUNTER — Telehealth: Payer: Self-pay | Admitting: Family Medicine

## 2013-09-20 NOTE — Telephone Encounter (Signed)
Call patient: Bone density test shows that she still has osteopenia, mildly thin bones. This is stable from previous. No statistically significant change which is fantastic. Repeat bone density in about 2 years.

## 2013-09-21 NOTE — Telephone Encounter (Signed)
vm not set up.Linda Werner Linda Werner  

## 2013-09-22 ENCOUNTER — Telehealth: Payer: Self-pay | Admitting: *Deleted

## 2013-09-22 LAB — BASIC METABOLIC PANEL WITH GFR
GFR, Est African American: 68 mL/min
GFR, Est Non African American: 59 mL/min — ABNORMAL LOW
Glucose, Bld: 95 mg/dL (ref 70–99)
Potassium: 4 mEq/L (ref 3.5–5.3)
Sodium: 141 mEq/L (ref 135–145)

## 2013-09-22 MED ORDER — LOSARTAN POTASSIUM 50 MG PO TABS: 50.0000 mg | ORAL_TABLET | Freq: Every day | ORAL | Status: DC

## 2013-09-22 NOTE — Progress Notes (Signed)
Quick Note:  All labs are normal. ______ 

## 2013-09-22 NOTE — Telephone Encounter (Signed)
Ok to mail letter.

## 2013-09-22 NOTE — Telephone Encounter (Signed)
Pt states she seen you on 09/09/13 and states that she still has a tickle in her throat. She states that you told her to call back if it didn't get any better and you would send her something to the pharmacy.  Meyer Cory, LPN

## 2013-09-22 NOTE — Telephone Encounter (Signed)
Okay. I will discontinue her benazepril which might be causing the cough and send over another prescription for losartan. She is to take this in its place.

## 2013-09-23 NOTE — Telephone Encounter (Signed)
LM on VM with response.  Meyer Cory, LPN

## 2013-09-29 ENCOUNTER — Ambulatory Visit: Payer: Medicare Other | Admitting: Family Medicine

## 2013-10-13 ENCOUNTER — Encounter: Payer: Self-pay | Admitting: Family Medicine

## 2013-10-29 ENCOUNTER — Encounter: Payer: Self-pay | Admitting: Family Medicine

## 2013-12-31 ENCOUNTER — Encounter: Payer: Self-pay | Admitting: Family Medicine

## 2013-12-31 ENCOUNTER — Ambulatory Visit (INDEPENDENT_AMBULATORY_CARE_PROVIDER_SITE_OTHER): Payer: Medicare Other | Admitting: Family Medicine

## 2013-12-31 VITALS — BP 122/73 | HR 81 | Ht 61.0 in | Wt 147.0 lb

## 2013-12-31 DIAGNOSIS — R7301 Impaired fasting glucose: Secondary | ICD-10-CM

## 2013-12-31 DIAGNOSIS — N183 Chronic kidney disease, stage 3 unspecified: Secondary | ICD-10-CM

## 2013-12-31 DIAGNOSIS — M949 Disorder of cartilage, unspecified: Secondary | ICD-10-CM

## 2013-12-31 DIAGNOSIS — M899 Disorder of bone, unspecified: Secondary | ICD-10-CM

## 2013-12-31 DIAGNOSIS — E785 Hyperlipidemia, unspecified: Secondary | ICD-10-CM

## 2013-12-31 DIAGNOSIS — I10 Essential (primary) hypertension: Secondary | ICD-10-CM

## 2013-12-31 LAB — COMPLETE METABOLIC PANEL WITH GFR
ALT: 26 U/L (ref 0–35)
AST: 26 U/L (ref 0–37)
Albumin: 4.6 g/dL (ref 3.5–5.2)
Alkaline Phosphatase: 84 U/L (ref 39–117)
BILIRUBIN TOTAL: 0.7 mg/dL (ref 0.2–1.2)
BUN: 15 mg/dL (ref 6–23)
CALCIUM: 9.9 mg/dL (ref 8.4–10.5)
CHLORIDE: 105 meq/L (ref 96–112)
CO2: 25 meq/L (ref 19–32)
CREATININE: 0.99 mg/dL (ref 0.50–1.10)
GFR, EST AFRICAN AMERICAN: 66 mL/min
GFR, EST NON AFRICAN AMERICAN: 58 mL/min — AB
GLUCOSE: 86 mg/dL (ref 70–99)
Potassium: 4.1 mEq/L (ref 3.5–5.3)
Sodium: 141 mEq/L (ref 135–145)
Total Protein: 7.3 g/dL (ref 6.0–8.3)

## 2013-12-31 LAB — POCT GLYCOSYLATED HEMOGLOBIN (HGB A1C): HEMOGLOBIN A1C: 5.9

## 2013-12-31 LAB — LIPID PANEL
Cholesterol: 222 mg/dL — ABNORMAL HIGH (ref 0–200)
HDL: 53 mg/dL (ref >39)
LDL CALC: 141 mg/dL — AB (ref 0–99)
Total CHOL/HDL Ratio: 4.2 Ratio
Triglycerides: 139 mg/dL (ref <150)
VLDL: 28 mg/dL (ref 0–40)

## 2013-12-31 NOTE — Progress Notes (Signed)
   Subjective:    Patient ID: Linda Werner, female    DOB: 1941/02/23, 73 y.o.   MRN: [id number]  HPI Hypertension- Pt denies chest pain, SOB, dizziness, or heart palpitations.  Taking meds as directed w/o problems.  Denies medication side effects.    Impaired fasting glucose-due for repeat A1c today.  No increased thirst or urination.  Hyperlipidemia-due for repeat cholesterol. She is not on treatment for this.  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 She's actually been enjoying it.  Review of Systems  BP 122/73  Pulse 81  Ht 5\' 1"  (1.549 m)  Wt 147 lb (66.679 kg)  BMI 27.79 kg/m2    Allergies  Allergen Reactions  . Alendronate Sodium     REACTION: rash  . Benazepril Cough  . Statins     REACTION: myalgias  . Verapamil Hcl     REACTION: swelling    Past Medical History  Diagnosis Date  . Hypertension   . Hypercholesteremia   . Osteoporosis     Past Surgical History  Procedure Laterality Date  . Abdominal hysterectomy    . Breast biopsy  2011    left    History   Social History  . Marital Status: Divorced    Spouse Name: N/A    Number of Children: 1  . Years of Education: N/A   Occupational History  . Retired Corporate treasurer    Social History Main Topics  . Smoking status: Never Smoker   . Smokeless tobacco: Not on file  . Alcohol Use: No  . Drug Use: No  . Sexual Activity:    Other Topics Concern  . Not on file   Social History Narrative   Exercises regularly.  No caffeine.     Family History  Problem Relation Age of Onset  . Hypertension      Outpatient Encounter Prescriptions as of 12/31/2013  Medication Sig  . amLODipine (NORVASC) 10 MG tablet Take 1 tablet (10 mg total) by mouth daily.  . calcium carbonate (OS-CAL) 600 MG TABS Take 600 mg by mouth 2 (two) times daily with a meal.  . losartan (COZAAR) 50 MG tablet Take 1 tablet (50 mg total) by mouth daily.  . Multiple Vitamins-Minerals (CENTRUM SILVER PO) Take 1  tablet by mouth once.  . Vitamin D, Cholecalciferol, 400 UNITS CHEW Chew by mouth.          Objective:   Physical Exam  Constitutional: She is oriented to person, place, and time. She appears well-developed and well-nourished.  HENT:  Head: Normocephalic and atraumatic.  Cardiovascular: Normal rate, regular rhythm and normal heart sounds.   Pulmonary/Chest: Effort normal and breath sounds normal.  Neurological: She is alert and oriented to person, place, and time.  Skin: Skin is warm and dry.  Psychiatric: She has a normal mood and affect. Her behavior is normal.          Assessment & Plan:  Hypertension-well-controlled. Continue current regimen. Followup in 6 months.  Impaired fasting glucose-A1c today is 5.9. Up a little from last time. Continue to watch diet and exercise. F/U in 6 mo.   Hyperlipidemia-due for repeat lipid. Lab slip given today.  Declined shingles vaccine today.  Osteopenia-recheck vitamin D level today. She's off of her bisphosphonate.

## 2014-01-01 LAB — VITAMIN D 25 HYDROXY (VIT D DEFICIENCY, FRACTURES): Vit D, 25-Hydroxy: 90 ng/mL — ABNORMAL HIGH (ref 30–89)

## 2014-02-01 ENCOUNTER — Other Ambulatory Visit: Payer: Self-pay | Admitting: Family Medicine

## 2014-03-07 ENCOUNTER — Telehealth: Payer: Self-pay | Admitting: *Deleted

## 2014-03-07 NOTE — Telephone Encounter (Signed)
Pt called because she was having problems logging into my chart. Told her to call the # on the last page of her AVS [phone] and they will assist her.Linda Werner

## 2014-03-22 ENCOUNTER — Ambulatory Visit (INDEPENDENT_AMBULATORY_CARE_PROVIDER_SITE_OTHER): Payer: Medicare Other | Admitting: Physician Assistant

## 2014-03-22 ENCOUNTER — Encounter: Payer: Self-pay | Admitting: Physician Assistant

## 2014-03-22 VITALS — BP 120/77 | HR 114 | Temp 99.3°F | Ht 61.0 in | Wt 144.0 lb

## 2014-03-22 DIAGNOSIS — I498 Other specified cardiac arrhythmias: Secondary | ICD-10-CM

## 2014-03-22 DIAGNOSIS — R011 Cardiac murmur, unspecified: Secondary | ICD-10-CM

## 2014-03-22 DIAGNOSIS — J069 Acute upper respiratory infection, unspecified: Secondary | ICD-10-CM

## 2014-03-22 DIAGNOSIS — R000 Tachycardia, unspecified: Secondary | ICD-10-CM

## 2014-03-22 MED ORDER — AMOXICILLIN-POT CLAVULANATE 875-125 MG PO TABS: 1.0000 | ORAL_TABLET | Freq: Two times a day (BID) | ORAL | Status: DC

## 2014-03-22 NOTE — Progress Notes (Signed)
   Subjective:    Patient ID: Linda Werner, female    DOB: February 14, 1941, 73 y.o.   MRN: [id number]  HPI Pt is a 73 yo female who presents to the clinic with sneezing, sore throat, fatigue. Symptoms start 1 day ago. First started with sneezing. Not coughing and no production. She has ran a low grade fever. No ear pain or sinus pressure. Throat is scratchy but not really painful. She denies any SOB or wheezing. She has been taking Theraflu and decongestant.     Review of Systems  All other systems reviewed and are negative.      Objective:   Physical Exam  Constitutional: She is oriented to person, place, and time. She appears well-developed and well-nourished.  HENT:  Head: Normocephalic and atraumatic.  Right Ear: External ear normal.  Left Ear: External ear normal.  Mouth/Throat: Oropharynx is clear and moist.  TM's clear bilaterally.  Oropharynx erythematous with PND, no tonsillar swelling or exudate.  Negative for any maxillary and sinus tenderness.   Eyes:  Injected conjunctiva bilaterally.   Neck: Normal range of motion. Neck supple.  Cardiovascular: Regular rhythm.   Tachycardia at 114.  Systolic murmur heard best over aortic 2/6. Per pt been evaluated in past with echo.   Pulmonary/Chest: Effort normal and breath sounds normal. She has no wheezes.  Lymphadenopathy:    She has no cervical adenopathy.  Neurological: She is alert and oriented to person, place, and time.  Skin: Skin is dry.  Psychiatric: She has a normal mood and affect. Her behavior is normal.          Assessment & Plan:   URI- discussed with pt I feel like symptoms are consistent with viral illness. Gave augmentin to start in 3-5 days if not improving. Pt was really adamant about getting an abx. Pt aware if viral abx will not work. Discussed regular mucinex, hydration and rest. Written out of work for 2 days. If symptoms worsening or changing please let us know. Declined any cough rx. Please  follow up if not improving.   Sinus tachycardia- stay away from OTC decongestant due to increase in heart rate.   Systolic heart murmur- per pt been evaluated in the past with echo and cardiology work up.

## 2014-03-22 NOTE — Patient Instructions (Signed)

## 2014-05-19 ENCOUNTER — Other Ambulatory Visit: Payer: Self-pay | Admitting: Family Medicine

## 2014-06-03 ENCOUNTER — Other Ambulatory Visit: Payer: Self-pay | Admitting: Family Medicine

## 2014-06-30 ENCOUNTER — Other Ambulatory Visit: Payer: Self-pay | Admitting: Family Medicine

## 2014-08-25 ENCOUNTER — Encounter: Payer: Medicare Other | Admitting: Family Medicine

## 2014-09-09 ENCOUNTER — Ambulatory Visit (INDEPENDENT_AMBULATORY_CARE_PROVIDER_SITE_OTHER): Payer: Medicare Other | Admitting: Family Medicine

## 2014-09-09 ENCOUNTER — Ambulatory Visit (INDEPENDENT_AMBULATORY_CARE_PROVIDER_SITE_OTHER): Payer: Medicare Other | Admitting: *Deleted

## 2014-09-09 ENCOUNTER — Encounter: Payer: Self-pay | Admitting: Family Medicine

## 2014-09-09 VITALS — BP 112/64 | HR 75 | Temp 97.6°F | Wt 141.0 lb

## 2014-09-09 DIAGNOSIS — R7301 Impaired fasting glucose: Secondary | ICD-10-CM

## 2014-09-09 DIAGNOSIS — I10 Essential (primary) hypertension: Secondary | ICD-10-CM

## 2014-09-09 DIAGNOSIS — Z23 Encounter for immunization: Secondary | ICD-10-CM

## 2014-09-09 DIAGNOSIS — E785 Hyperlipidemia, unspecified: Secondary | ICD-10-CM

## 2014-09-09 LAB — COMPLETE METABOLIC PANEL WITH GFR
ALBUMIN: 4.3 g/dL (ref 3.5–5.2)
ALT: 18 U/L (ref 0–35)
AST: 21 U/L (ref 0–37)
Alkaline Phosphatase: 85 U/L (ref 39–117)
BUN: 11 mg/dL (ref 6–23)
CALCIUM: 9.5 mg/dL (ref 8.4–10.5)
CHLORIDE: 103 meq/L (ref 96–112)
CO2: 26 mEq/L (ref 19–32)
CREATININE: 0.96 mg/dL (ref 0.50–1.10)
GFR, EST NON AFRICAN AMERICAN: 60 mL/min
GFR, Est African American: 69 mL/min
Glucose, Bld: 96 mg/dL (ref 70–99)
Potassium: 3.8 mEq/L (ref 3.5–5.3)
Sodium: 139 mEq/L (ref 135–145)
Total Bilirubin: 0.7 mg/dL (ref 0.2–1.2)
Total Protein: 6.8 g/dL (ref 6.0–8.3)

## 2014-09-09 LAB — LIPID PANEL
CHOLESTEROL: 200 mg/dL (ref 0–200)
HDL: 46 mg/dL (ref >39)
LDL Cholesterol: 135 mg/dL — ABNORMAL HIGH (ref 0–99)
Total CHOL/HDL Ratio: 4.3 Ratio
Triglycerides: 96 mg/dL (ref <150)
VLDL: 19 mg/dL (ref 0–40)

## 2014-09-09 MED ORDER — EZETIMIBE 10 MG PO TABS: 10.0000 mg | ORAL_TABLET | Freq: Every day | ORAL | Status: DC

## 2014-09-09 NOTE — Progress Notes (Signed)
   Subjective:    Patient ID: Linda Werner, female    DOB: December 12, 1940, 73 y.o.   MRN: [id number]  HPI Hypertension- Pt denies chest pain, SOB, dizziness, or heart palpitations.  Taking meds as directed w/o problems.  Denies medication side effects.    Hyperlipidemia - not on a statin.  Has been intolerant to lipitor. Did well on zetia at one time but it became costly.   Lab Results  Component Value Date   CHOL 222* 12/31/2013   HDL 53 12/31/2013   LDLCALC 141* 12/31/2013   LDLDIRECT 162* 03/22/2009   TRIG 139 12/31/2013   CHOLHDL 4.2 12/31/2013    IFG - no inc thrist or urination.    Review of Systems     Objective:   Physical Exam  Constitutional: She is oriented to person, place, and time. She appears well-developed and well-nourished.  HENT:  Head: Normocephalic and atraumatic.  Neck: Neck supple. No thyromegaly present.  Cardiovascular: Normal rate, regular rhythm and normal heart sounds.   Pulmonary/Chest: Effort normal and breath sounds normal.  Abdominal: Soft. Bowel sounds are normal. She exhibits no distension and no mass. There is no tenderness. There is no rebound and no guarding.  Neurological: She is alert and oriented to person, place, and time.  Skin: Skin is warm and dry.  Psychiatric: She has a normal mood and affect. Her behavior is normal.          Assessment & Plan:  HTN - well controlled. F/U in 64months. Due for CMP and lipid  Hyperlipidemia - Her 10 year risk assessment risk assesment score of 10.9%.  Will retry the zetia   Will follow nad recheck lipids in 6 months.   IFG - Up to 6.2 today. Work on diet and exercise. REcheck in 6 month.    Flu vaccine given today.

## 2014-09-19 ENCOUNTER — Encounter: Payer: Self-pay | Admitting: Family Medicine

## 2014-12-11 ENCOUNTER — Other Ambulatory Visit: Payer: Self-pay | Admitting: Family Medicine

## 2015-01-22 ENCOUNTER — Other Ambulatory Visit: Payer: Self-pay | Admitting: Family Medicine

## 2015-03-13 ENCOUNTER — Other Ambulatory Visit: Payer: Self-pay | Admitting: Family Medicine

## 2015-03-15 ENCOUNTER — Encounter: Payer: Self-pay | Admitting: Family Medicine

## 2015-03-15 ENCOUNTER — Ambulatory Visit (INDEPENDENT_AMBULATORY_CARE_PROVIDER_SITE_OTHER): Payer: Medicare Other | Admitting: Family Medicine

## 2015-03-15 ENCOUNTER — Telehealth: Payer: Self-pay | Admitting: Family Medicine

## 2015-03-15 VITALS — BP 154/62 | HR 65 | Ht 61.0 in | Wt 143.0 lb

## 2015-03-15 DIAGNOSIS — N183 Chronic kidney disease, stage 3 unspecified: Secondary | ICD-10-CM

## 2015-03-15 DIAGNOSIS — I10 Essential (primary) hypertension: Secondary | ICD-10-CM

## 2015-03-15 DIAGNOSIS — R7301 Impaired fasting glucose: Secondary | ICD-10-CM

## 2015-03-15 MED ORDER — AMLODIPINE BESYLATE 10 MG PO TABS: 10.0000 mg | ORAL_TABLET | Freq: Every day | ORAL | Status: DC

## 2015-03-15 MED ORDER — LOSARTAN POTASSIUM 100 MG PO TABS: 100.0000 mg | ORAL_TABLET | Freq: Every day | ORAL | Status: DC

## 2015-03-15 MED ORDER — EZETIMIBE 10 MG PO TABS: ORAL_TABLET | ORAL | Status: DC

## 2015-03-15 MED ORDER — LOSARTAN POTASSIUM 50 MG PO TABS: 50.0000 mg | ORAL_TABLET | Freq: Every day | ORAL | Status: DC

## 2015-03-15 NOTE — Telephone Encounter (Signed)
Please call pt and let her know i am increasing her losartan to 100mg .   I wouldl ike ot see her back on about 6 week for repeat BP check . Can do as nurse visit.

## 2015-03-15 NOTE — Telephone Encounter (Signed)
Pt notified of losartan increase.  She stated that she will call back to schedule nurse visit for  BP check.

## 2015-03-15 NOTE — Progress Notes (Signed)
   Subjective:    Patient ID: Linda Werner, female    DOB: 1940-11-11, 74 y.o.   MRN: [id number]  HPI Hypertension- Pt denies chest pain, SOB, dizziness, or heart palpitations.  Taking meds as directed w/o problems.  Denies medication side effects.    IFG - last a1C was 5.9. No inc thirst or urination.   Lab Results  Component Value Date   HGBA1C 5.9 12/31/2013   Pain in first finger and occ elbow.  Says doing her exercises seem to help.  Review of Systems     Objective:   Physical Exam  Constitutional: She is oriented to person, place, and time. She appears well-developed and well-nourished.  HENT:  Head: Normocephalic and atraumatic.  Cardiovascular: Normal rate, regular rhythm and normal heart sounds.   Pulmonary/Chest: Effort normal and breath sounds normal.  Neurological: She is alert and oriented to person, place, and time.  Skin: Skin is warm and dry.  Psychiatric: She has a normal mood and affect. Her behavior is normal.          Assessment & Plan:  HTN - Uncontrolled.  Inc losaratan to 100 mg. continue amlodipine.F/U in 6 weeks for BP check.    IFG - Stable no change. F/U in 6 months.   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   Prevnar 13 - discussed today. Given H.O. She wanted to think about.

## 2015-03-22 ENCOUNTER — Telehealth: Payer: Self-pay | Admitting: Family Medicine

## 2015-03-22 NOTE — Telephone Encounter (Signed)
Attempted to contact Pt regarding Rx compliance, received fax from [facility] stating they believe she is noncompliant with her: Losartan,Atorvastatin,Lisinopril,metformin,Crestor,and Pravastatin. Provided callback information for clarification.

## 2015-04-17 ENCOUNTER — Other Ambulatory Visit: Payer: Self-pay

## 2015-04-19 ENCOUNTER — Other Ambulatory Visit: Payer: Self-pay | Admitting: *Deleted

## 2015-04-19 MED ORDER — EZETIMIBE 10 MG PO TABS
ORAL_TABLET | ORAL | Status: DC
Start: 2015-04-19 — End: 2016-04-15

## 2015-04-19 MED ORDER — LOSARTAN POTASSIUM 100 MG PO TABS: 100.0000 mg | ORAL_TABLET | Freq: Every day | ORAL | Status: DC

## 2015-04-19 MED ORDER — AMLODIPINE BESYLATE 10 MG PO TABS: 10.0000 mg | ORAL_TABLET | Freq: Every day | ORAL | Status: DC

## 2015-04-19 NOTE — Telephone Encounter (Signed)
Pt called and stated that she didn't understand why her losartan 50 mg was increased to 100 mg. She said that she had not had any testing that should warrant this change. I informed her that due to her last bp reading this is why it was changed. Pt stated that she did not wish to take the 100 mg and that her bp readings have been fine. I told her that she could cut the tablet in 1/2 and that we would f/u with her regarding this after she came in for a nurse visit and her bp reading was better. She stated that the day she was seen she had been out of her medication for about 5 days and believes this was the reason that her reading was so high. Maryruth Eve, [place] Crocker

## 2015-06-13 ENCOUNTER — Telehealth: Payer: Self-pay | Admitting: Family Medicine

## 2015-06-13 NOTE — Telephone Encounter (Signed)
Attempted to contact Pt regarding compliance with Losartan 100mg  tablets per [employer] we received via fax. Left message to return clinic call, provided callback information.

## 2015-06-20 LAB — BASIC METABOLIC PANEL WITH GFR
BUN: 14 mg/dL (ref 7–25)
CALCIUM: 9.8 mg/dL (ref 8.6–10.4)
CHLORIDE: 105 mmol/L (ref 98–110)
CO2: 26 mmol/L (ref 20–31)
Creat: 0.92 mg/dL (ref 0.60–0.93)
GFR, EST NON AFRICAN AMERICAN: 62 mL/min (ref >=60)
GFR, Est African American: 72 mL/min (ref >=60)
Glucose, Bld: 90 mg/dL (ref 65–99)
Potassium: 4 mmol/L (ref 3.5–5.3)
Sodium: 141 mmol/L (ref 135–146)

## 2015-06-20 NOTE — Progress Notes (Signed)
Quick Note:  All labs are normal. ______ 

## 2015-07-20 ENCOUNTER — Telehealth: Payer: Self-pay | Admitting: Family Medicine

## 2015-07-20 MED ORDER — AMLODIPINE BESYLATE 10 MG PO TABS: 10.0000 mg | ORAL_TABLET | Freq: Every day | ORAL | Status: DC

## 2015-07-20 NOTE — Telephone Encounter (Signed)
Called Pt to check compliance on her Losartan Rx per insurance request. Pt reports taking this Rx daily with no complications.

## 2015-07-28 ENCOUNTER — Ambulatory Visit: Payer: Medicare Other | Admitting: Family Medicine

## 2015-08-08 ENCOUNTER — Encounter (HOSPITAL_BASED_OUTPATIENT_CLINIC_OR_DEPARTMENT_OTHER): Payer: Self-pay

## 2015-08-08 ENCOUNTER — Emergency Department (HOSPITAL_BASED_OUTPATIENT_CLINIC_OR_DEPARTMENT_OTHER)
Admission: EM | Admit: 2015-08-08 | Discharge: 2015-08-08 | Disposition: A | Discharge status: Home / Self Care | Payer: Medicare Other | Attending: Emergency Medicine | Admitting: Emergency Medicine

## 2015-08-08 ENCOUNTER — Emergency Department (HOSPITAL_BASED_OUTPATIENT_CLINIC_OR_DEPARTMENT_OTHER): Payer: Medicare Other

## 2015-08-08 DIAGNOSIS — Y998 Other external cause status: Secondary | ICD-10-CM | POA: Insufficient documentation

## 2015-08-08 DIAGNOSIS — S99922A Unspecified injury of left foot, initial encounter: Secondary | ICD-10-CM | POA: Diagnosis not present

## 2015-08-08 DIAGNOSIS — I10 Essential (primary) hypertension: Secondary | ICD-10-CM | POA: Insufficient documentation

## 2015-08-08 DIAGNOSIS — X58XXXA Exposure to other specified factors, initial encounter: Secondary | ICD-10-CM | POA: Insufficient documentation

## 2015-08-08 DIAGNOSIS — Y9289 Other specified places as the place of occurrence of the external cause: Secondary | ICD-10-CM | POA: Insufficient documentation

## 2015-08-08 DIAGNOSIS — E7800 Pure hypercholesterolemia, unspecified: Secondary | ICD-10-CM | POA: Diagnosis not present

## 2015-08-08 DIAGNOSIS — Z79899 Other long term (current) drug therapy: Secondary | ICD-10-CM | POA: Diagnosis not present

## 2015-08-08 DIAGNOSIS — Y9389 Activity, other specified: Secondary | ICD-10-CM | POA: Diagnosis not present

## 2015-08-08 DIAGNOSIS — S93402A Sprain of unspecified ligament of left ankle, initial encounter: Secondary | ICD-10-CM | POA: Insufficient documentation

## 2015-08-08 DIAGNOSIS — M79606 Pain in leg, unspecified: Secondary | ICD-10-CM

## 2015-08-08 DIAGNOSIS — M810 Age-related osteoporosis without current pathological fracture: Secondary | ICD-10-CM | POA: Diagnosis not present

## 2015-08-08 DIAGNOSIS — S99912A Unspecified injury of left ankle, initial encounter: Secondary | ICD-10-CM | POA: Diagnosis present

## 2015-08-08 NOTE — ED Provider Notes (Signed)
CSN: [id number]     Arrival date & time 08/08/15  1158 History   First MD Initiated Contact with Patient 08/08/15 1230     Chief Complaint  Patient presents with  . Leg Injury     (Consider location/radiation/quality/duration/timing/severity/associated sxs/prior Treatment) Patient is a 74 y.o. female presenting with lower extremity pain. The history is provided by the patient.  Foot Pain This is a new problem. Episode onset: 10 days ago. The problem occurs constantly. The problem has been gradually worsening. Associated symptoms comments: Left ankle and foot pain and swelling.  Initially started 10days ago when she got up from the couch and twisted her ankle.  Since that time she has had swelling that will resolve with rest and elevation however today the swelling and pain are worsening in the foot. The symptoms are aggravated by walking. The symptoms are relieved by rest. She has tried rest for the symptoms. The treatment provided no relief.    Past Medical History  Diagnosis Date  . Hypertension   . Hypercholesteremia   . Osteoporosis    Past Surgical History  Procedure Laterality Date  . Abdominal hysterectomy    . Breast biopsy  2011    left   Family History  Problem Relation Age of Onset  . Hypertension     Social History  Substance Use Topics  . Smoking status: Never Smoker   . Smokeless tobacco: None  . Alcohol Use: No   OB History    No data available     Review of Systems  All other systems reviewed and are negative.     Allergies  Alendronate sodium; Atorvastatin; Benazepril; Statins; and Verapamil hcl  Home Medications   Prior to Admission medications   Medication Sig Start Date End Date Taking? Authorizing Provider  amLODipine (NORVASC) 10 MG tablet Take 1 tablet (10 mg total) by mouth daily. 07/20/15   Hali Marry, MD  calcium carbonate (OS-CAL) 600 MG TABS Take 600 mg by mouth 2 (two) times daily with a meal.    Historical Provider, MD    ezetimibe (ZETIA) 10 MG tablet TAKE 1 TABLET (10 MG TOTAL) BY MOUTH DAILY. 04/19/15   Hali Marry, MD  losartan (COZAAR) 100 MG tablet Take 1 tablet (100 mg total) by mouth daily. 04/19/15   Hali Marry, MD  Multiple Vitamins-Minerals (CENTRUM SILVER PO) Take 1 tablet by mouth once.    Historical Provider, MD  Vitamin D, Cholecalciferol, 400 UNITS CHEW Chew by mouth.    Historical Provider, MD   BP 133/108 mmHg  Pulse 90  Temp(Src) 98.3 F (36.8 C) (Oral)  Resp 18  Ht 5\' 1"  (1.549 m)  Wt 144 lb (65.318 kg)  BMI 27.22 kg/m2  SpO2 99% Physical Exam  Constitutional: She is oriented to person, place, and time. She appears well-developed and well-nourished. No distress.  HENT:  Head: Normocephalic and atraumatic.  Mouth/Throat: Oropharynx is clear and moist.  Eyes: Conjunctivae and EOM are normal. Pupils are equal, round, and reactive to light.  Neck: Normal range of motion. Neck supple.  Cardiovascular: Normal rate and intact distal pulses.   Pulmonary/Chest: Effort normal.  Musculoskeletal: Normal range of motion. She exhibits edema and tenderness.       Left ankle: She exhibits swelling. She exhibits normal range of motion and normal pulse. Tenderness. Lateral malleolus and head of 5th metatarsal tenderness found.       Feet:  Neurological: She is alert and oriented to person, place, and  time.  Skin: Skin is warm and dry. No rash noted. No erythema.  Psychiatric: She has a normal mood and affect. Her behavior is normal.  Nursing note and vitals reviewed.   ED Course  Procedures (including critical care time) Labs Review Labs Reviewed - No data to display  Imaging Review No results found. I have personally reviewed and evaluated these images and lab results as part of my medical decision-making.   EKG Interpretation None      MDM   Final diagnoses:  None    Patient with a twisting injury to her left ankle and foot 10 days ago when she was getting  off the couch with persistent swelling that's worsening with now new pain. Patient denies any other trauma or injury. His only painful when she attempts to walk. The swelling usually resolves with elevation however today the swelling has not resolved. On exam patient has significant swelling to her ankle and foot with fifth metatarsal head tenderness.  We'll start by getting plain films of the foot and ankle. If no fractures are identified will do a Doppler to ensure no DVT    Blanchie Dessert, MD 08/09/15 [phone]

## 2015-08-08 NOTE — ED Provider Notes (Signed)
Care assumed from Dr. Maryan Rued at shift change. Pt with stated L lower leg injury with swelling. Xrays negative. [place] pending. If negative for DVT, can d/c home with air cast.  LE venous duplex negative for DVT. Pt updated. Stable for d/c. F/u with orthopedics and/or PCP. Return precautions given. Pt/family/caregiver aware medical decision making process and agreeable with plan.  Carman Ching, PA-C 08/08/15 1604  Blanchie Dessert, MD 08/12/15 [phone]

## 2015-08-08 NOTE — Discharge Instructions (Signed)
Ankle Sprain °An ankle sprain is an injury to the strong, fibrous tissues (ligaments) that hold the bones of your ankle joint together.  °CAUSES °An ankle sprain is usually caused by a fall or by twisting your ankle. Ankle sprains most commonly occur when you step on the outer edge of your foot, and your ankle turns inward. People who participate in sports are more prone to these types of injuries.  °SYMPTOMS  °· Pain in your ankle. The pain may be present at rest or only when you are trying to stand or walk. °· Swelling. °· Bruising. Bruising may develop immediately or within 1 to 2 days after your injury. °· Difficulty standing or walking, particularly when turning corners or changing directions. °DIAGNOSIS  °Your caregiver will ask you details about your injury and perform a physical exam of your ankle to determine if you have an ankle sprain. During the physical exam, your caregiver will press on and apply pressure to specific areas of your foot and ankle. Your caregiver will try to move your ankle in certain ways. An X-ray exam may be done to be sure a bone was not broken or a ligament did not separate from one of the bones in your ankle (avulsion fracture).  °TREATMENT  °Certain types of braces can help stabilize your ankle. Your caregiver can make a recommendation for this. Your caregiver may recommend the use of medicine for pain. If your sprain is severe, your caregiver may refer you to a surgeon who helps to restore function to parts of your skeletal system (orthopedist) or a physical therapist. °HOME CARE INSTRUCTIONS  °· Apply ice to your injury for 1-2 days or as directed by your caregiver. Applying ice helps to reduce inflammation and pain. °· Put ice in a plastic bag. °· Place a towel between your skin and the bag. °· Leave the ice on for 15-20 minutes at a time, every 2 hours while you are awake. °· Only take over-the-counter or prescription medicines for pain, discomfort, or fever as directed by  your caregiver. °· Elevate your injured ankle above the level of your heart as much as possible for 2-3 days. °· If your caregiver recommends crutches, use them as instructed. Gradually put weight on the affected ankle. Continue to use crutches or a cane until you can walk without feeling pain in your ankle. °· If you have a plaster splint, wear the splint as directed by your caregiver. Do not rest it on anything harder than a pillow for the first 24 hours. Do not put weight on it. Do not get it wet. You may take it off to take a shower or bath. °· You may have been given an elastic bandage to wear around your ankle to provide support. If the elastic bandage is too tight (you have numbness or tingling in your foot or your foot becomes cold and blue), adjust the bandage to make it comfortable. °· If you have an air splint, you may blow more air into it or let air out to make it more comfortable. You may take your splint off at night and before taking a shower or bath. Wiggle your toes in the splint several times per day to decrease swelling. °SEEK MEDICAL CARE IF:  °· You have rapidly increasing bruising or swelling. °· Your toes feel extremely cold or you lose feeling in your foot. °· Your pain is not relieved with medicine. °SEEK IMMEDIATE MEDICAL CARE IF: °· Your toes are numb or blue. °·   You have severe pain that is increasing. MAKE SURE YOU:   Understand these instructions.  Will watch your condition.  Will get help right away if you are not doing well or get worse.   This information is not intended to replace advice given to you by your health care provider. Make sure you discuss any questions you have with your health care provider.   Document Released: 10/07/2005 Document Revised: 10/28/2014 Document Reviewed: 10/19/2011 Elsevier Interactive Patient Education 2016 [place] for Routine Care of Injuries Theroutine careofmanyinjuriesincludes rest, ice, compression, and elevation  (RICE therapy). RICE therapy is often recommended for injuries to soft tissues, such as a muscle strain, ligament injuries, bruises, and overuse injuries. It can also be used for some bony injuries. Using RICE therapy can help to relieve pain, lessen swelling, and enable your body to heal. Rest Rest is required to allow your body to heal. This usually involves reducing your normal activities and avoiding use of the injured part of your body. Generally, you can return to your normal activities when you are comfortable and have been given permission by your health care provider. Ice Icing your injury helps to keep the swelling down, and it lessens pain. Do not apply ice directly to your skin.  Put ice in a plastic bag.  Place a towel between your skin and the bag.  Leave the ice on for 20 minutes, 2-3 times a day. Do this for as long as you are directed by your health care provider. Compression Compression means putting pressure on the injured area. Compression helps to keep swelling down, gives support, and helps with discomfort. Compression may be done with an elastic bandage. If an elastic bandage has been applied, follow these general tips:  Remove and reapply the bandage every 3-4 hours or as directed by your health care provider.  Make sure the bandage is not wrapped too tightly, because this can cut off circulation. If part of your body beyond the bandage becomes blue, numb, cold, swollen, or more painful, your bandage is most likely too tight. If this occurs, remove your bandage and reapply it more loosely.  See your health care provider if the bandage seems to be making your problems worse rather than better. Elevation Elevation means keeping the injured area raised. This helps to lessen swelling and decrease pain. If possible, your injured area should be elevated at or above the level of your heart or the center of your chest. [place]? You should seek medical  care if:  Your pain and swelling continue.  Your symptoms are getting worse rather than improving. These symptoms may indicate that further evaluation or further X-rays are needed. Sometimes, X-rays may not show a small broken bone (fracture) until a number of days later. Make a follow-up appointment with your health care provider. WHEN SHOULD I SEEK IMMEDIATE MEDICAL CARE? You should seek immediate medical care if:  You have sudden severe pain at or below the area of your injury.  You have redness or increased swelling around your injury.  You have tingling or numbness at or below the area of your injury that does not improve after you remove the elastic bandage.   This information is not intended to replace advice given to you by your health care provider. Make sure you discuss any questions you have with your health care provider.   Document Released: 01/19/2001 Document Revised: 06/28/2015 Document Reviewed: 09/14/2014 Elsevier Interactive Patient Education [employer].

## 2015-08-08 NOTE — ED Notes (Signed)
Injured left LE getting up from couch approx 1 week ago-pt with steady gait-NAD

## 2015-08-08 NOTE — ED Notes (Signed)
PA at bedside discussing test results and dispo plan of care. 

## 2015-08-16 ENCOUNTER — Other Ambulatory Visit: Payer: Self-pay | Admitting: Family Medicine

## 2015-11-30 ENCOUNTER — Telehealth: Payer: Self-pay

## 2015-11-30 DIAGNOSIS — E785 Hyperlipidemia, unspecified: Secondary | ICD-10-CM

## 2015-11-30 DIAGNOSIS — R7301 Impaired fasting glucose: Secondary | ICD-10-CM

## 2015-11-30 NOTE — Telephone Encounter (Signed)
Linda Werner is coming in for a physical on the 17 th. She would like to come in ahead of time for labs. What labs should I order?

## 2015-11-30 NOTE — Telephone Encounter (Signed)
Patient aware. Labs ordered.

## 2015-11-30 NOTE — Telephone Encounter (Signed)
Cmp, lipid, and A1C

## 2015-12-08 ENCOUNTER — Ambulatory Visit: Payer: Medicare Other | Admitting: Family Medicine

## 2015-12-12 DIAGNOSIS — Z803 Family history of malignant neoplasm of breast: Secondary | ICD-10-CM | POA: Diagnosis not present

## 2015-12-12 DIAGNOSIS — Z1231 Encounter for screening mammogram for malignant neoplasm of breast: Secondary | ICD-10-CM | POA: Diagnosis not present

## 2015-12-12 DIAGNOSIS — M810 Age-related osteoporosis without current pathological fracture: Secondary | ICD-10-CM | POA: Diagnosis not present

## 2015-12-12 DIAGNOSIS — M8589 Other specified disorders of bone density and structure, multiple sites: Secondary | ICD-10-CM | POA: Diagnosis not present

## 2015-12-12 LAB — HM DEXA SCAN

## 2015-12-12 LAB — HM MAMMOGRAPHY

## 2015-12-20 ENCOUNTER — Encounter: Payer: Self-pay | Admitting: Family Medicine

## 2015-12-20 ENCOUNTER — Telehealth: Payer: Self-pay | Admitting: Family Medicine

## 2015-12-20 NOTE — Telephone Encounter (Signed)
Please call patient: Bone density test done in February shows that she is now osteoporotic. Her T score has decreased to -2.5. I would recommend a bone builder such as Fosamax or Boniva in addition to regular calcium with vitamin D and regular exercise to help keep the bone strong. If she is okay with this prescription please let me know. The Fosamax is taken once weekly and is usually fairly well tolerated. We would then repeat her bone density in 2 years.

## 2015-12-20 NOTE — Telephone Encounter (Signed)
Pt states she has taken Actonel in the past, questions if PCP would like her to start that Rx again? If so, a new Rx will be required. Pt states she has tried Fosamax in the past and "it didn't agree with her," Pt has never tried [place].  Pt also questions mammogram results, do not see where they are entered yet. Will see if PCP has them yet- imaging was completed about 2 weeks ago.

## 2015-12-21 ENCOUNTER — Encounter: Payer: Self-pay | Admitting: Family Medicine

## 2015-12-21 ENCOUNTER — Ambulatory Visit (INDEPENDENT_AMBULATORY_CARE_PROVIDER_SITE_OTHER): Payer: Commercial Managed Care - HMO | Admitting: Family Medicine

## 2015-12-21 VITALS — BP 132/80 | HR 75 | Wt 144.0 lb

## 2015-12-21 DIAGNOSIS — Z23 Encounter for immunization: Secondary | ICD-10-CM

## 2015-12-21 DIAGNOSIS — M810 Age-related osteoporosis without current pathological fracture: Secondary | ICD-10-CM

## 2015-12-21 DIAGNOSIS — Z0000 Encounter for general adult medical examination without abnormal findings: Secondary | ICD-10-CM

## 2015-12-21 MED ORDER — IBANDRONATE SODIUM 150 MG PO TABS: 150.0000 mg | ORAL_TABLET | ORAL | Status: DC

## 2015-12-21 MED ORDER — KETOCONAZOLE 2 % EX CREA: 1.0000 "application " | TOPICAL_CREAM | Freq: Every day | CUTANEOUS | Status: DC

## 2015-12-21 MED ORDER — RISEDRONATE SODIUM 150 MG PO TABS: 150.0000 mg | ORAL_TABLET | ORAL | Status: DC

## 2015-12-21 NOTE — Telephone Encounter (Signed)
Pt has an appt today with PCP. She states she will discuss the Boniva at that time.   Pt had her mammogram at [facility] [place] in [place]. Will call and get report so it can be discussed at visit today too.

## 2015-12-21 NOTE — Progress Notes (Signed)
Subjective:    Linda Werner is a 75 y.o. female who presents for Medicare Annual/Subsequent preventive examination.  Preventive Screening-Counseling & Management  Tobacco History  Smoking status  . Never Smoker   Smokeless tobacco  . Not on file     Problems Prior to Visit 1.   Current Problems (verified) Patient Active Problem List   Diagnosis Date Noted  . Systolic murmur 123456  . Cataract, senile 09/02/2012  . CKD (chronic kidney disease) stage 3, GFR 30-59 ml/min 03/22/2009  . VITILIGO 03/22/2009  . IMPAIRED FASTING GLUCOSE 03/22/2009  . Hyperlipemia 09/01/2008  . ESSENTIAL HYPERTENSION, BENIGN 09/01/2008  . Osteoporosis 09/01/2008    Medications Prior to Visit Current Outpatient Prescriptions on File Prior to Visit  Medication Sig Dispense Refill  . amLODipine (NORVASC) 10 MG tablet TAKE 1 TABLET BY MOUTH DAILY 30 tablet 5  . calcium carbonate (OS-CAL) 600 MG TABS Take 600 mg by mouth 2 (two) times daily with a meal.    . ezetimibe (ZETIA) 10 MG tablet TAKE 1 TABLET (10 MG TOTAL) BY MOUTH DAILY. 30 tablet 11  . losartan (COZAAR) 100 MG tablet Take 1 tablet (100 mg total) by mouth daily. 30 tablet 5  . Multiple Vitamins-Minerals (CENTRUM SILVER PO) Take 1 tablet by mouth once.    . Vitamin D, Cholecalciferol, 400 UNITS CHEW Chew by mouth.     No current facility-administered medications on file prior to visit.    Current Medications (verified) Current Outpatient Prescriptions  Medication Sig Dispense Refill  . amLODipine (NORVASC) 10 MG tablet TAKE 1 TABLET BY MOUTH DAILY 30 tablet 5  . calcium carbonate (OS-CAL) 600 MG TABS Take 600 mg by mouth 2 (two) times daily with a meal.    . ezetimibe (ZETIA) 10 MG tablet TAKE 1 TABLET (10 MG TOTAL) BY MOUTH DAILY. 30 tablet 11  . losartan (COZAAR) 100 MG tablet Take 1 tablet (100 mg total) by mouth daily. 30 tablet 5  . Multiple Vitamins-Minerals (CENTRUM SILVER PO) Take 1 tablet by mouth once.    .  risedronate (ACTONEL) 150 MG tablet Take 1 tablet (150 mg total) by mouth every 30 (thirty) days. with water on empty stomach, nothing by mouth or lie down for next 30 minutes. 1 tablet 11  . Vitamin D, Cholecalciferol, 400 UNITS CHEW Chew by mouth.     No current facility-administered medications for this visit.     Allergies (verified) Alendronate sodium; Atorvastatin; Benazepril; Statins; and Verapamil hcl   PAST HISTORY  Family History Family History  Problem Relation Age of Onset  . Hypertension      Social History Social History  Substance Use Topics  . Smoking status: Never Smoker   . Smokeless tobacco: Not on file  . Alcohol Use: No     Are there smokers in your home (other than you)? No  Risk Factors Current exercise habits: The patient does not participate in regular exercise at present.  Dietary issues discussed: None   Cardiac risk factors: advanced age (older than 57 for men, 19 for women), dyslipidemia, hypertension and IFG.  Depression Screen (Note: if answer to either of the following is "Yes", a more complete depression screening is indicated)   Over the past two weeks, have you felt down, depressed or hopeless? No  Over the past two weeks, have you felt little interest or pleasure in doing things? No  Have you lost interest or pleasure in daily life? No  Do you often feel hopeless? No  Do you cry easily over simple problems? No  Activities of Daily Living In your present state of health, do you have any difficulty performing the following activities?:  Driving? No Managing money?  No Feeding yourself? No Getting from bed to chair? No Climbing a flight of stairs? No Preparing food and eating?: No Bathing or showering? No Getting dressed: No Getting to the toilet? No Using the toilet:No Moving around from place to place: No In the past year have you fallen or had a near fall?:No   Are you sexually active?  No  Do you have more than one partner?   No  Hearing Difficulties: No Do you often ask people to speak up or repeat themselves? No Do you experience ringing or noises in your ears? No Do you have difficulty understanding soft or whispered voices? No   Do you feel that you have a problem with memory? No  Do you often misplace items? No  Do you feel safe at home?  No  Cognitive Testing  Alert? Yes  Normal Appearance?Yes  Oriented to person? Yes  Place? Yes   Time? Yes  Recall of three objects?  Yes  Can perform simple calculations? Yes  Displays appropriate judgment?Yes  Can read the correct time from a watch face?Yes   Advanced Directives have been discussed with the patient? Yes  List the Names of Other Physician/Practitioners you currently use: 1.   None  Indicate any recent Medical Services you may have received from other than [facility] providers in the past year (date may be approximate).  Immunization History  Administered Date(s) Administered  . Influenza Split 09/02/2012  . Influenza Whole 09/01/2008  . Influenza,inj,Quad PF,36+ Mos 09/09/2013, 09/09/2014, 12/21/2015  . Pneumococcal Polysaccharide-23 10/21/2008  . Tdap 12/28/2012    Screening Tests Health Maintenance  Topic Date Due  . PNA vac Low Risk Adult (2 of 2 - PCV13) 10/21/2009  . INFLUENZA VACCINE  05/22/2015  . COLONOSCOPY  10/22/2015  . ZOSTAVAX  12/20/2016 (Originally 12/18/2002)  . MAMMOGRAM  09/08/2016  . TETANUS/TDAP  12/29/2022  . DEXA SCAN  Completed    All answers were reviewed with the patient and necessary referrals were made:  Atlantis Delong, MD   12/21/2015   History reviewed: allergies, current medications, past family history, past medical history, past social history, past surgical history and problem list  Review of Systems A comprehensive review of systems was negative.    Objective:     Vision by Snellen chart: Call for up to date eye exam report.   Body mass index is 27.22 kg/(m^2). BP 141/74 mmHg  Pulse 75   Wt 144 lb (65.318 kg)  SpO2 100%  BP 141/74 mmHg  Pulse 75  Wt 144 lb (65.318 kg)  SpO2 100% General appearance: alert, cooperative and appears stated age Head: Normocephalic, without obvious abnormality, atraumatic Eyes:   Ears: conj clear, EOMI< PEERLA Nose: Nares normal. Septum midline. Mucosa normal. No drainage or sinus tenderness. Throat: lips, mucosa, and tongue normal; teeth and gums normal Neck: no adenopathy, no carotid bruit, no JVD, supple, symmetrical, trachea midline and thyroid not enlarged, symmetric, no tenderness/mass/nodules Back: symmetric, no curvature. ROM normal. No CVA tenderness. Lungs: clear to auscultation bilaterally Heart: regular rate and rhythm, S1, S2 normal, no murmur, click, rub or gallop Abdomen: soft, non-tender; bowel sounds normal; no masses,  no organomegaly Extremities: extremities normal, atraumatic, no cyanosis or edema Pulses: 2+ and symmetric Skin: Skin color, texture, turgor normal. No rashes or lesions. Some  mild hypopigmentation along the facial jaw line.   Lymph nodes: Cervical adenopathy: nl and Supraclavicular adenopathy: nl Neurologic: Alert and oriented X 3, normal strength and tone. Normal symmetric reflexes. Normal coordination and gait     Assessment:     Annual Medicare Wellness       Plan:     During the course of the visit the patient was educated and counseled about appropriate screening and preventive services including:    Pneumococcal vaccine   Influenza vaccine  Colorectal cancer screening  zostavax   Osteoporosis-check vitamin D level  Hyperpigmentation along the jawline-suspicious for tinea versicolor-we'll treat with ketoconazole cream.  HTN - well controlled. F/U in 6 months. Due for labs.    Diet review for nutrition referral? Yes ____  Not Indicated _x__   Patient Instructions (the written plan) was given to the patient.  Medicare Attestation I have personally reviewed: The patient's medical  and social history Their use of alcohol, tobacco or illicit drugs Their current medications and supplements The patient's functional ability including ADLs,fall risks, home safety risks, cognitive, and hearing and visual impairment Diet and physical activities Evidence for depression or mood disorders  The patient's weight, height, BMI, and visual acuity have been recorded in the chart.  I have made referrals, counseling, and provided education to the patient based on review of the above and I have provided the patient with a written personalized care plan for preventive services.     Effie Janoski, MD   12/21/2015

## 2015-12-21 NOTE — Telephone Encounter (Signed)
The Boniva will be cheaper on her plan and you just take 1 tab 1 time a month so it's pretty easy. I will send that over to her pharmacy and she can give it a try. If she doesn't tolerate it and we can go back to the alendronate. Also not sure about the mammogram. Please checking care everywhere and see if we are able to find the results and they are.

## 2015-12-22 DIAGNOSIS — E785 Hyperlipidemia, unspecified: Secondary | ICD-10-CM | POA: Diagnosis not present

## 2015-12-22 DIAGNOSIS — R7301 Impaired fasting glucose: Secondary | ICD-10-CM | POA: Diagnosis not present

## 2015-12-23 LAB — LIPID PANEL
CHOL/HDL RATIO: 3.8 ratio (ref <=5.0)
CHOLESTEROL: 198 mg/dL (ref 125–200)
HDL: 52 mg/dL (ref >=46)
LDL CALC: 109 mg/dL (ref <130)
TRIGLYCERIDES: 184 mg/dL — AB (ref <150)
VLDL: 37 mg/dL — AB (ref <30)

## 2015-12-23 LAB — COMPLETE METABOLIC PANEL WITH GFR
ALT: 21 U/L (ref 6–29)
AST: 24 U/L (ref 10–35)
Albumin: 4.3 g/dL (ref 3.6–5.1)
Alkaline Phosphatase: 99 U/L (ref 33–130)
BUN: 11 mg/dL (ref 7–25)
CHLORIDE: 106 mmol/L (ref 98–110)
CO2: 28 mmol/L (ref 20–31)
CREATININE: 0.9 mg/dL (ref 0.60–0.93)
Calcium: 9.4 mg/dL (ref 8.6–10.4)
GFR, EST AFRICAN AMERICAN: 73 mL/min (ref >=60)
GFR, Est Non African American: 64 mL/min (ref >=60)
Glucose, Bld: 87 mg/dL (ref 65–99)
POTASSIUM: 3.7 mmol/L (ref 3.5–5.3)
Sodium: 141 mmol/L (ref 135–146)
Total Bilirubin: 1 mg/dL (ref 0.2–1.2)
Total Protein: 6.9 g/dL (ref 6.1–8.1)

## 2015-12-23 LAB — VITAMIN D 25 HYDROXY (VIT D DEFICIENCY, FRACTURES): Vit D, 25-Hydroxy: 50 ng/mL (ref 30–100)

## 2015-12-23 LAB — HEMOGLOBIN A1C
Hgb A1c MFr Bld: 6.2 % — ABNORMAL HIGH (ref <5.7)
MEAN PLASMA GLUCOSE: 131 mg/dL — AB (ref <117)

## 2015-12-26 NOTE — Addendum Note (Signed)
Addended by: Beatrice Lecher D on: 12/26/2015 12:00 PM   Modules accepted: Orders, SmartSet

## 2016-04-15 ENCOUNTER — Other Ambulatory Visit: Payer: Self-pay | Admitting: Family Medicine

## 2016-04-24 ENCOUNTER — Other Ambulatory Visit: Payer: Self-pay | Admitting: *Deleted

## 2016-04-24 MED ORDER — AMLODIPINE BESYLATE 10 MG PO TABS: 10.0000 mg | ORAL_TABLET | Freq: Every day | ORAL | Status: DC

## 2016-04-24 MED ORDER — LOSARTAN POTASSIUM 100 MG PO TABS: 100.0000 mg | ORAL_TABLET | Freq: Every day | ORAL | Status: DC

## 2016-04-24 MED ORDER — EZETIMIBE 10 MG PO TABS: ORAL_TABLET | ORAL | Status: DC

## 2016-05-11 ENCOUNTER — Other Ambulatory Visit: Payer: Self-pay | Admitting: Family Medicine

## 2016-06-07 ENCOUNTER — Ambulatory Visit (INDEPENDENT_AMBULATORY_CARE_PROVIDER_SITE_OTHER): Payer: Commercial Managed Care - HMO | Admitting: Family Medicine

## 2016-06-07 ENCOUNTER — Encounter: Payer: Self-pay | Admitting: Family Medicine

## 2016-06-07 VITALS — BP 132/78 | HR 80 | Wt 145.0 lb

## 2016-06-07 DIAGNOSIS — I10 Essential (primary) hypertension: Secondary | ICD-10-CM

## 2016-06-07 DIAGNOSIS — E559 Vitamin D deficiency, unspecified: Secondary | ICD-10-CM | POA: Diagnosis not present

## 2016-06-07 DIAGNOSIS — R7301 Impaired fasting glucose: Secondary | ICD-10-CM

## 2016-06-07 DIAGNOSIS — Z0000 Encounter for general adult medical examination without abnormal findings: Secondary | ICD-10-CM

## 2016-06-07 DIAGNOSIS — Z23 Encounter for immunization: Secondary | ICD-10-CM | POA: Diagnosis not present

## 2016-06-07 MED ORDER — RISEDRONATE SODIUM 30 MG PO TABS: 30.0000 mg | ORAL_TABLET | ORAL | 3 refills | Status: DC

## 2016-06-07 NOTE — Progress Notes (Signed)
Subjective:   Linda Werner is a 75 y.o. female who presents for Medicare Annual (Subsequent) preventive examination.  IFG - no inc thirst or urination.   She's having difficulty getting her Zetia. Discussed cost over $200 a month. She's also tried multiple statins in the past and did not tolerate them well. Sshe would like to get back on Actonel. She tried Fosamax and did not tolerate it. Has been on Actonel before and it really well with it. She read the side effect profile for the [facility] and was not interested in taking it.  Walks for exercise.    Review of Systems:  Comprehensive ROS is negative        Objective:     Vitals: BP (!) 147/65   Pulse 80   Wt 145 lb (65.8 kg)   BMI 27.40 kg/m   Body mass index is 27.4 kg/m.   Tobacco History  Smoking Status  . Never Smoker  Smokeless Tobacco  . Not on file     Counseling given: Not Answered   Past Medical History:  Diagnosis Date  . Allergy   . Anxiety   . Arthritis not diagnosed  . Cataract   . CKD (chronic kidney disease) stage 3, GFR 30-59 ml/min   . Heart murmur last visit  . Hypercholesteremia   . Hypertension   . Neuromuscular disorder ([place]) not diagnosed  . Osteoporosis    Past Surgical History:  Procedure Laterality Date  . ABDOMINAL HYSTERECTOMY    . BREAST BIOPSY  2011   left   Family History  Problem Relation Age of Onset  . Hypertension    . Arthritis Sister   . Cancer Sister   . Early death Daughter    History  Sexual Activity  . Sexual activity: No    Outpatient Encounter Prescriptions as of 06/07/2016  Medication Sig  . amLODipine (NORVASC) 10 MG tablet Take 1 tablet (10 mg total) by mouth daily.  . calcium carbonate (OS-CAL) 600 MG TABS Take 600 mg by mouth 2 (two) times daily with a meal.  . ketoconazole (NIZORAL) 2 % cream Apply 1 application topically daily. X 3 weeks.  Marland Kitchen losartan (COZAAR) 100 MG tablet Take 1 tablet (100 mg total) by mouth daily.  . Multiple  Vitamins-Minerals (CENTRUM SILVER PO) Take 1 tablet by mouth once.  . Vitamin D, Cholecalciferol, 400 UNITS CHEW Chew by mouth.  . [DISCONTINUED] ezetimibe (ZETIA) 10 MG tablet TAKE 1 TABLET (10 MG TOTAL) BY MOUTH DAILY.  . risedronate (ACTONEL) 30 MG tablet Take 1 tablet (30 mg total) by mouth every 7 (seven) days. with water on empty stomach, nothing by mouth or lie down for next 30 minutes. She has tried and failed alendronate.   No facility-administered encounter medications on file as of 06/07/2016.     Activities of Daily Living In your present state of health, do you have any difficulty performing the following activities: 06/07/2016  Hearing? N  Vision? N  Difficulty concentrating or making decisions? N  Walking or climbing stairs? N  Dressing or bathing? N  Doing errands, shopping? N  Some recent data might be hidden    Patient Care Team: Hali Marry, MD as PCP - General (Family Medicine)    Assessment:    Medicare Wellness Exam  Exercise Activities and Dietary recommendations Current Exercise Habits: Home exercise routine, Type of exercise: walking, Frequency (Times/Week): 5  Goals    None     Fall Risk  Fall Risk  03/15/2015 12/31/2013 09/09/2013  Falls in the past year? No No No   Depression Screen PHQ 2/9 Scores 03/15/2015 12/31/2013 09/09/2013  PHQ - 2 Score 0 0 0     Cognitive Testing No flowsheet data found.   6 CIT score is normal.    Immunization History  Administered Date(s) Administered  . Influenza Split 09/02/2012  . Influenza Whole 09/01/2008  . Influenza,inj,Quad PF,36+ Mos 09/09/2013, 09/09/2014, 12/21/2015  . Pneumococcal Polysaccharide-23 10/21/2008  . Tdap 12/28/2012   Screening Tests Health Maintenance  Topic Date Due  . INFLUENZA VACCINE  05/21/2016  . ZOSTAVAX  12/20/2016 (Originally 12/18/2002)  . PNA vac Low Risk Adult (2 of 2 - PCV13) 12/20/2016 (Originally 10/21/2009)  . COLONOSCOPY  08/21/2016  . MAMMOGRAM  12/11/2017   . TETANUS/TDAP  12/29/2022  . DEXA SCAN  Completed      Plan:     During the course of the visit the patient was educated and counseled about the following appropriate screening and preventive services:   Vaccines to include Influenza- given today.   Cardiovascular Disease  Colorectal cancer screening- UTD.    Bone density screening  Diabetes screening - done  Glaucoma screening  Mammography/PAP - UTD  Nutrition counseling  - no needed.   Osteoporosis-she does take her calcium. We'll try to get Actonel covered. May require a letter authorization.  IFG - well controlled.  A1C looks great today at 5.6.    Patient Instructions (the written plan) was given to the patient.   METHENEY,CATHERINE, MD  06/07/2016

## 2016-06-07 NOTE — Patient Instructions (Signed)
Health Maintenance, Female Adopting a healthy lifestyle and getting preventive care can go a long way to promote health and wellness. Talk with your health care provider about what schedule of regular examinations is right for you. This is a good chance for you to check in with your provider about disease prevention and staying healthy. In between checkups, there are plenty of things you can do on your own. Experts have done a lot of research about which lifestyle changes and preventive measures are most likely to keep you healthy. Ask your health care provider for more information. WEIGHT AND DIET  Eat a healthy diet  Be sure to include plenty of vegetables, fruits, low-fat dairy products, and lean protein.  Do not eat a lot of foods high in solid fats, added sugars, or salt.  Get regular exercise. This is one of the most important things you can do for your health.  Most adults should exercise for at least 150 minutes each week. The exercise should increase your heart rate and make you sweat (moderate-intensity exercise).  Most adults should also do strengthening exercises at least twice a week. This is in addition to the moderate-intensity exercise.  Maintain a healthy weight  Body mass index (BMI) is a measurement that can be used to identify possible weight problems. It estimates body fat based on height and weight. Your health care provider can help determine your BMI and help you achieve or maintain a healthy weight.  For females 28 years of age and older:   A BMI below 18.5 is considered underweight.  A BMI of 18.5 to 24.9 is normal.  A BMI of 25 to 29.9 is considered overweight.  A BMI of 30 and above is considered obese.  Watch levels of cholesterol and blood lipids  You should start having your blood tested for lipids and cholesterol at 72 years of age, then have this test every 5 years.  You may need to have your cholesterol levels checked more often if:  Your lipid  or cholesterol levels are high.  You are older than 73 years of age.  You are at high risk for heart disease.  CANCER SCREENING   Lung Cancer  Lung cancer screening is recommended for adults 75-66 years old who are at high risk for lung cancer because of a history of smoking.  A yearly low-dose CT scan of the lungs is recommended for people who:  Currently smoke.  Have quit within the past 15 years.  Have at least a 30-pack-year history of smoking. A pack year is smoking an average of one pack of cigarettes a day for 1 year.  Yearly screening should continue until it has been 15 years since you quit.  Yearly screening should stop if you develop a health problem that would prevent you from having lung cancer treatment.  Breast Cancer  Practice breast self-awareness. This means understanding how your breasts normally appear and feel.  It also means doing regular breast self-exams. Let your health care provider know about any changes, no matter how small.  If you are in your 20s or 30s, you should have a clinical breast exam (CBE) by a health care provider every 1-3 years as part of a regular health exam.  If you are 25 or older, have a CBE every year. Also consider having a breast X-ray (mammogram) every year.  If you have a family history of breast cancer, talk to your health care provider about genetic screening.  If you  are at high risk for breast cancer, talk to your health care provider about having an MRI and a mammogram every year.  Breast cancer gene (BRCA) assessment is recommended for women who have family members with BRCA-related cancers. BRCA-related cancers include:  Breast.  Ovarian.  Tubal.  Peritoneal cancers.  Results of the assessment will determine the need for genetic counseling and BRCA1 and BRCA2 testing. Cervical Cancer Your health care provider may recommend that you be screened regularly for cancer of the pelvic organs (ovaries, uterus, and  vagina). This screening involves a pelvic examination, including checking for microscopic changes to the surface of your cervix (Pap test). You may be encouraged to have this screening done every 3 years, beginning at age 21.  For women ages 30-65, health care providers may recommend pelvic exams and Pap testing every 3 years, or they may recommend the Pap and pelvic exam, combined with testing for human papilloma virus (HPV), every 5 years. Some types of HPV increase your risk of cervical cancer. Testing for HPV may also be done on women of any age with unclear Pap test results.  Other health care providers may not recommend any screening for nonpregnant women who are considered low risk for pelvic cancer and who do not have symptoms. Ask your health care provider if a screening pelvic exam is right for you.  If you have had past treatment for cervical cancer or a condition that could lead to cancer, you need Pap tests and screening for cancer for at least 20 years after your treatment. If Pap tests have been discontinued, your risk factors (such as having a new sexual partner) need to be reassessed to determine if screening should resume. Some women have medical problems that increase the chance of getting cervical cancer. In these cases, your health care provider may recommend more frequent screening and Pap tests. Colorectal Cancer  This type of cancer can be detected and often prevented.  Routine colorectal cancer screening usually begins at 73 years of age and continues through 73 years of age.  Your health care provider may recommend screening at an earlier age if you have risk factors for colon cancer.  Your health care provider may also recommend using home test kits to check for hidden blood in the stool.  A small camera at the end of a tube can be used to examine your colon directly (sigmoidoscopy or colonoscopy). This is done to check for the earliest forms of colorectal  cancer.  Routine screening usually begins at age 50.  Direct examination of the colon should be repeated every 5-10 years through 73 years of age. However, you may need to be screened more often if early forms of precancerous polyps or small growths are found. Skin Cancer  Check your skin from head to toe regularly.  Tell your health care provider about any new moles or changes in moles, especially if there is a change in a mole's shape or color.  Also tell your health care provider if you have a mole that is larger than the size of a pencil eraser.  Always use sunscreen. Apply sunscreen liberally and repeatedly throughout the day.  Protect yourself by wearing long sleeves, pants, a wide-brimmed hat, and sunglasses whenever you are outside. HEART DISEASE, DIABETES, AND HIGH BLOOD PRESSURE   High blood pressure causes heart disease and increases the risk of stroke. High blood pressure is more likely to develop in:  People who have blood pressure in the high end   of the normal range (130-139/85-89 mm Hg).  People who are overweight or obese.  People who are African American.  If you are 38-23 years of age, have your blood pressure checked every 3-5 years. If you are 61 years of age or older, have your blood pressure checked every year. You should have your blood pressure measured twice--once when you are at a hospital or clinic, and once when you are not at a hospital or clinic. Record the average of the two measurements. To check your blood pressure when you are not at a hospital or clinic, you can use:  An automated blood pressure machine at a pharmacy.  A home blood pressure monitor.  If you are between 45 years and 39 years old, ask your health care provider if you should take aspirin to prevent strokes.  Have regular diabetes screenings. This involves taking a blood sample to check your fasting blood sugar level.  If you are at a normal weight and have a low risk for diabetes,  have this test once every three years after 73 years of age.  If you are overweight and have a high risk for diabetes, consider being tested at a younger age or more often. PREVENTING INFECTION  Hepatitis B  If you have a higher risk for hepatitis B, you should be screened for this virus. You are considered at high risk for hepatitis B if:  You were born in a country where hepatitis B is common. Ask your health care provider which countries are considered high risk.  Your parents were born in a high-risk country, and you have not been immunized against hepatitis B (hepatitis B vaccine).  You have HIV or AIDS.  You use needles to inject street drugs.  You live with someone who has hepatitis B.  You have had sex with someone who has hepatitis B.  You get hemodialysis treatment.  You take certain medicines for conditions, including cancer, organ transplantation, and autoimmune conditions. Hepatitis C  Blood testing is recommended for:  Everyone born from [birth year] through 1963.  Anyone with known risk factors for hepatitis C. Sexually transmitted infections (STIs)  You should be screened for sexually transmitted infections (STIs) including gonorrhea and chlamydia if:  You are sexually active and are younger than 73 years of age.  You are older than 73 years of age and your health care provider tells you that you are at risk for this type of infection.  Your sexual activity has changed since you were last screened and you are at an increased risk for chlamydia or gonorrhea. Ask your health care provider if you are at risk.  If you do not have HIV, but are at risk, it may be recommended that you take a prescription medicine daily to prevent HIV infection. This is called pre-exposure prophylaxis (PrEP). You are considered at risk if:  You are sexually active and do not regularly use condoms or know the HIV status of your partner(s).  You take drugs by injection.  You are sexually  active with a partner who has HIV. Talk with your health care provider about whether you are at high risk of being infected with HIV. If you choose to begin PrEP, you should first be tested for HIV. You should then be tested every 3 months for as long as you are taking PrEP.  PREGNANCY   If you are premenopausal and you may become pregnant, ask your health care provider about preconception counseling.  If you may  become pregnant, take 400 to 800 micrograms (mcg) of folic acid every day.  If you want to prevent pregnancy, talk to your health care provider about birth control (contraception). OSTEOPOROSIS AND MENOPAUSE   Osteoporosis is a disease in which the bones lose minerals and strength with aging. This can result in serious bone fractures. Your risk for osteoporosis can be identified using a bone density scan.  If you are 61 years of age or older, or if you are at risk for osteoporosis and fractures, ask your health care provider if you should be screened.  Ask your health care provider whether you should take a calcium or vitamin D supplement to lower your risk for osteoporosis.  Menopause may have certain physical symptoms and risks.  Hormone replacement therapy may reduce some of these symptoms and risks. Talk to your health care provider about whether hormone replacement therapy is right for you.  HOME CARE INSTRUCTIONS   Schedule regular health, dental, and eye exams.  Stay current with your immunizations.   Do not use any tobacco products including cigarettes, chewing tobacco, or electronic cigarettes.  If you are pregnant, do not drink alcohol.  If you are breastfeeding, limit how much and how often you drink alcohol.  Limit alcohol intake to no more than 1 drink per day for nonpregnant women. One drink equals 12 ounces of beer, 5 ounces of wine, or 1 ounces of hard liquor.  Do not use street drugs.  Do not share needles.  Ask your health care provider for help if  you need support or information about quitting drugs.  Tell your health care provider if you often feel depressed.  Tell your health care provider if you have ever been abused or do not feel safe at home.   This information is not intended to replace advice given to you by your health care provider. Make sure you discuss any questions you have with your health care provider.   Document Released: 04/22/2011 Document Revised: 10/28/2014 Document Reviewed: 09/08/2013 Elsevier Interactive Patient Education [employer].

## 2016-07-01 ENCOUNTER — Ambulatory Visit: Payer: Commercial Managed Care - HMO | Admitting: Family Medicine

## 2016-07-01 NOTE — Progress Notes (Deleted)
Subjective:    CC:   HPI:  Hypertension- Pt denies chest pain, SOB, dizziness, or heart palpitations.  Taking meds as directed w/o problems.  Denies medication side effects.    IFG -   CKD-    Past medical history, Surgical history, Family history not pertinant except as noted below, Social history, Allergies, and medications have been entered into the medical record, reviewed, and corrections made.   Review of Systems: No fevers, chills, night sweats, weight loss, chest pain, or shortness of breath.   Objective:    General: Well Developed, well nourished, and in no acute distress.  Neuro: Alert and oriented x3, extra-ocular muscles intact, sensation grossly intact.  HEENT: Normocephalic, atraumatic  Skin: Warm and dry, no rashes. Cardiac: Regular rate and rhythm, no murmurs rubs or gallops, no lower extremity edema.  Respiratory: Clear to auscultation bilaterally. Not using accessory muscles, speaking in full sentences.   Impression and Recommendations:    HTN -   IFG -   CKD -

## 2016-07-15 ENCOUNTER — Other Ambulatory Visit: Payer: Self-pay | Admitting: *Deleted

## 2016-07-15 MED ORDER — RISEDRONATE SODIUM 30 MG PO TABS: 30.0000 mg | ORAL_TABLET | ORAL | 3 refills | Status: DC

## 2016-07-16 ENCOUNTER — Telehealth: Payer: Self-pay | Admitting: *Deleted

## 2016-08-30 ENCOUNTER — Encounter: Payer: Self-pay | Admitting: Family Medicine

## 2016-08-30 ENCOUNTER — Ambulatory Visit (INDEPENDENT_AMBULATORY_CARE_PROVIDER_SITE_OTHER): Payer: Commercial Managed Care - HMO | Admitting: Family Medicine

## 2016-08-30 VITALS — BP 149/80 | HR 86 | Ht 61.0 in | Wt 146.0 lb

## 2016-08-30 DIAGNOSIS — N183 Chronic kidney disease, stage 3 unspecified: Secondary | ICD-10-CM

## 2016-08-30 DIAGNOSIS — I10 Essential (primary) hypertension: Secondary | ICD-10-CM

## 2016-08-30 DIAGNOSIS — K625 Hemorrhage of anus and rectum: Secondary | ICD-10-CM | POA: Diagnosis not present

## 2016-08-30 NOTE — Patient Instructions (Signed)
Thank you for coming in today. Start taking 1/2 of the blood pressure pill in a few days.  Drink plenty of fluids.  Make a follow up appointment with Dr Madilyn Fireman soon.  If your belly pain worsens, or you have high fever, bad vomiting, blood in your stool or black tarry stool go to the Emergency Room.     Diverticulosis Diverticulosis is the condition that develops when small pouches (diverticula) form in the wall of your colon. Your colon, or large intestine, is where water is absorbed and stool is formed. The pouches form when the inside layer of your colon pushes through weak spots in the outer layers of your colon. CAUSES  No one knows exactly what causes diverticulosis. RISK FACTORS  Being older than 8. Your risk for this condition increases with age. Diverticulosis is rare in people younger than 40 years. By age 49, almost everyone has it.  Eating a low-fiber diet.  Being frequently constipated.  Being overweight.  Not getting enough exercise.  Smoking.  Taking over-the-counter pain medicines, like aspirin and ibuprofen. SYMPTOMS  Most people with diverticulosis do not have symptoms. DIAGNOSIS  Because diverticulosis often has no symptoms, health care providers often discover the condition during an exam for other colon problems. In many cases, a health care provider will diagnose diverticulosis while using a flexible scope to examine the colon (colonoscopy). TREATMENT  If you have never developed an infection related to diverticulosis, you may not need treatment. If you have had an infection before, treatment may include:  Eating more fruits, vegetables, and grains.  Taking a fiber supplement.  Taking a live bacteria supplement (probiotic).  Taking medicine to relax your colon. HOME CARE INSTRUCTIONS   Drink at least 6-8 glasses of water each day to prevent constipation.  Try not to strain when you have a bowel movement.  Keep all follow-up appointments. If you  have had an infection before:  Increase the fiber in your diet as directed by your health care provider or dietitian.  Take a dietary fiber supplement if your health care provider approves.  Only take medicines as directed by your health care provider. SEEK MEDICAL CARE IF:   You have abdominal pain.  You have bloating.  You have cramps.  You have not gone to the bathroom in 3 days. SEEK IMMEDIATE MEDICAL CARE IF:   Your pain gets worse.  Yourbloating becomes very bad.  You have a fever or chills, and your symptoms suddenly get worse.  You begin vomiting.  You have bowel movements that are bloody or black. MAKE SURE YOU:  Understand these instructions.  Will watch your condition.  Will get help right away if you are not doing well or get worse.   This information is not intended to replace advice given to you by your health care provider. Make sure you discuss any questions you have with your health care provider.   Document Released: 07/04/2004 Document Revised: 10/12/2013 Document Reviewed: 09/01/2013 Elsevier Interactive Patient Education [employer].

## 2016-08-30 NOTE — Progress Notes (Signed)
Linda Werner is a 75 y.o. female who presents to [facility] today for bloody diarrhea. Patient is a 2 day history of nonpainful bloody diarrhea. She denies any fevers or chills or vomiting. The symptoms are consistent with previous episodes of bloody diarrhea. She cannot recall if she ever been diagnosed with diverticulosis. She notes that she is due for her colonoscopy. The play diarrhea has stopped and she feels fine now. She did stop her blood pressure medicine losartan when she became ill. She notes her blood pressures have been around 120/60 at home recently.   Past Medical History:  Diagnosis Date  . Allergy   . Anxiety   . Arthritis not diagnosed  . Cataract   . CKD (chronic kidney disease) stage 3, GFR 30-59 ml/min   . Heart murmur last visit  . Hypercholesteremia   . Hypertension   . Neuromuscular disorder ([place]) not diagnosed  . Osteoporosis    Past Surgical History:  Procedure Laterality Date  . ABDOMINAL HYSTERECTOMY    . BREAST BIOPSY  2011   left   Social History  Substance Use Topics  . Smoking status: Never Smoker  . Smokeless tobacco: Not on file  . Alcohol use No   family history includes Arthritis in her sister; Cancer in her sister; Early death in her daughter.  ROS as above:  Medications: Current Outpatient Prescriptions  Medication Sig Dispense Refill  . amLODipine (NORVASC) 10 MG tablet Take 1 tablet (10 mg total) by mouth daily. 90 tablet 1  . calcium carbonate (OS-CAL) 600 MG TABS Take 600 mg by mouth 2 (two) times daily with a meal.    . losartan (COZAAR) 100 MG tablet Take 1 tablet (100 mg total) by mouth daily. 90 tablet 1  . Multiple Vitamins-Minerals (CENTRUM SILVER PO) Take 1 tablet by mouth once.    . risedronate (ACTONEL) 30 MG tablet Take 1 tablet (30 mg total) by mouth every 7 (seven) days. with water on empty  stomach, nothing by mouth or lie down for next 30 minutes. She has tried and failed alendronate. 36 tablet 3  . Vitamin D, Cholecalciferol, 400 UNITS CHEW Chew by mouth.     No current facility-administered medications for this visit.    Allergies  Allergen Reactions  . Alendronate Sodium     REACTION: rash  . Atorvastatin Other (See Comments)    Myalgias.    . Benazepril Cough  . Statins     REACTION: myalgias  . Verapamil Hcl     REACTION: swelling    Health Maintenance Health Maintenance  Topic Date Due  . COLONOSCOPY  08/21/2016  . ZOSTAVAX  12/20/2016 (Originally 12/18/2002)  . PNA vac Low Risk Adult (2 of 2 - PCV13) 12/20/2016 (Originally 10/21/2009)  . MAMMOGRAM  12/11/2017  . TETANUS/TDAP  12/29/2022  . INFLUENZA VACCINE  Completed  . DEXA SCAN  Completed     Exam:  BP (!) 149/80   Pulse 86   Ht 5\' 1"  (1.549 m)   Wt 146 lb (66.2 kg)   BMI 27.59 kg/m  Gen: Well NAD Nontoxic appearing HEENT: EOMI,  MMM Lungs: Normal work of breathing. CTABL Heart: RRR no MRG Abd: NABS, Soft. Nondistended, Nontender Exts: Brisk capillary refill, warm and well perfused.    No results found for this or any previous visit (from the past 72 hour(s)). No results found.    Assessment and Plan: 75 y.o. female  with nonpainful bloody diarrhea. Concerning for diverticulosis. Patient feels well and symptoms have resolved. Obtain basic workup below and follow-up with PCP. Patient would benefit from colonoscopy to assess for diverticula.   Orders Placed This Encounter  Procedures  . CBC  . COMPLETE METABOLIC PANEL WITH GFR  . Iron and TIBC  . Ferritin  . INR/PT    Discussed warning signs or symptoms. Please see discharge instructions. Patient expresses understanding.

## 2016-08-31 LAB — COMPLETE METABOLIC PANEL WITH GFR
ALT: 26 U/L (ref 6–29)
AST: 28 U/L (ref 10–35)
Albumin: 4.1 g/dL (ref 3.6–5.1)
Alkaline Phosphatase: 72 U/L (ref 33–130)
BUN: 10 mg/dL (ref 7–25)
CO2: 22 mmol/L (ref 20–31)
Calcium: 9 mg/dL (ref 8.6–10.4)
Chloride: 107 mmol/L (ref 98–110)
Creat: 0.74 mg/dL (ref 0.60–0.93)
GFR, EST NON AFRICAN AMERICAN: 81 mL/min (ref >=60)
GFR, Est African American: >89 mL/min (ref >=60)
GLUCOSE: 85 mg/dL (ref 65–99)
POTASSIUM: 3.5 mmol/L (ref 3.5–5.3)
SODIUM: 142 mmol/L (ref 135–146)
TOTAL PROTEIN: 6.9 g/dL (ref 6.1–8.1)
Total Bilirubin: 0.7 mg/dL (ref 0.2–1.2)

## 2016-08-31 LAB — CBC
HCT: 43.5 % (ref 35.0–45.0)
Hemoglobin: 14.2 g/dL (ref 11.7–15.5)
MCH: 29.2 pg (ref 27.0–33.0)
MCHC: 32.6 g/dL (ref 32.0–36.0)
MCV: 89.3 fL (ref 80.0–100.0)
MPV: 11 fL (ref 7.5–12.5)
PLATELETS: 201 10*3/uL (ref 140–400)
RBC: 4.87 MIL/uL (ref 3.80–5.10)
RDW: 14.2 % (ref 11.0–15.0)
WBC: 7.2 10*3/uL (ref 3.8–10.8)

## 2016-08-31 LAB — IRON AND TIBC
%SAT: 24 % (ref 11–50)
Iron: 80 ug/dL (ref 45–160)
TIBC: 331 ug/dL (ref 250–450)
UIBC: 251 ug/dL (ref 125–400)

## 2016-08-31 LAB — FERRITIN: Ferritin: 218 ng/mL (ref 20–288)

## 2016-08-31 LAB — PROTIME-INR
INR: 1
PROTHROMBIN TIME: 10.9 s (ref 9.0–11.5)

## 2016-09-06 NOTE — Telephone Encounter (Signed)
closed

## 2016-09-20 ENCOUNTER — Encounter: Payer: Self-pay | Admitting: Internal Medicine

## 2016-09-26 ENCOUNTER — Encounter: Payer: Self-pay | Admitting: Internal Medicine

## 2016-11-15 ENCOUNTER — Ambulatory Visit (AMBULATORY_SURGERY_CENTER): Payer: Self-pay | Admitting: *Deleted

## 2016-11-15 ENCOUNTER — Encounter: Payer: Self-pay | Admitting: Internal Medicine

## 2016-11-15 VITALS — Ht 61.0 in | Wt 145.6 lb

## 2016-11-15 DIAGNOSIS — K625 Hemorrhage of anus and rectum: Secondary | ICD-10-CM

## 2016-11-15 NOTE — Progress Notes (Signed)
No allergies to eggs or soy. No problems with anesthesia.  Pt given Emmi instructions for colonoscopy  No oxygen use  No diet drug use  

## 2016-11-29 ENCOUNTER — Encounter: Payer: Self-pay | Admitting: Internal Medicine

## 2016-11-29 ENCOUNTER — Ambulatory Visit (AMBULATORY_SURGERY_CENTER): Payer: Medicare HMO | Admitting: Internal Medicine

## 2016-11-29 VITALS — BP 123/68 | HR 45 | Temp 96.0°F | Resp 9 | Ht 61.0 in | Wt 145.0 lb

## 2016-11-29 DIAGNOSIS — Z1212 Encounter for screening for malignant neoplasm of rectum: Secondary | ICD-10-CM | POA: Diagnosis not present

## 2016-11-29 DIAGNOSIS — K625 Hemorrhage of anus and rectum: Secondary | ICD-10-CM | POA: Diagnosis not present

## 2016-11-29 DIAGNOSIS — Z1211 Encounter for screening for malignant neoplasm of colon: Secondary | ICD-10-CM | POA: Diagnosis present

## 2016-11-29 DIAGNOSIS — D122 Benign neoplasm of ascending colon: Secondary | ICD-10-CM

## 2016-11-29 DIAGNOSIS — E669 Obesity, unspecified: Secondary | ICD-10-CM | POA: Diagnosis not present

## 2016-11-29 DIAGNOSIS — I10 Essential (primary) hypertension: Secondary | ICD-10-CM | POA: Diagnosis not present

## 2016-11-29 MED ORDER — SODIUM CHLORIDE 0.9 % IV SOLN: 500.0000 mL | INTRAVENOUS | Status: DC

## 2016-11-29 NOTE — Patient Instructions (Addendum)
I found and removed one tiny polyp today. It looks benign.  I will let you know pathology results and when or if to have another routine colonoscopy by mail.  You also have a condition called diverticulosis - common and not usually a problem. Please read the handout provided.  I appreciate the opportunity to care for you. Gatha Mayer, MD, FACG   YOU HAD AN ENDOSCOPIC PROCEDURE TODAY AT [facility]:   Refer to the procedure report that was given to you for any specific questions about what was found during the examination.  If the procedure report does not answer your questions, please call your gastroenterologist to clarify.  If you requested that your care partner not be given the details of your procedure findings, then the procedure report has been included in a sealed envelope for you to review at your convenience later.  YOU SHOULD EXPECT: Some feelings of bloating in the abdomen. Passage of more gas than usual.  Walking can help get rid of the air that was put into your GI tract during the procedure and reduce the bloating. If you had a lower endoscopy (such as a colonoscopy or flexible sigmoidoscopy) you may notice spotting of blood in your stool or on the toilet paper. If you underwent a bowel prep for your procedure, you may not have a normal bowel movement for a few days.  Please Note:  You might notice some irritation and congestion in your nose or some drainage.  This is from the oxygen used during your procedure.  There is no need for concern and it should clear up in a day or so.  SYMPTOMS TO REPORT IMMEDIATELY:   Following lower endoscopy (colonoscopy or flexible sigmoidoscopy):  Excessive amounts of blood in the stool  Significant tenderness or worsening of abdominal pains  Swelling of the abdomen that is new, acute  Fever of 100F or higher    For urgent or emergent issues, a gastroenterologist can be reached at any hour by calling [phone]  [phone].   DIET:  We do recommend a small meal at first, but then you may proceed to your regular diet.  Drink plenty of fluids but you should avoid alcoholic beverages for 24 hours.  ACTIVITY:  You should plan to take it easy for the rest of today and you should NOT DRIVE or use heavy machinery until tomorrow (because of the sedation medicines used during the test).    FOLLOW UP: Our staff will call the number listed on your records the next business day following your procedure to check on you and address any questions or concerns that you may have regarding the information given to you following your procedure. If we do not reach you, we will leave a message.  However, if you are feeling well and you are not experiencing any problems, there is no need to return our call.  We will assume that you have returned to your regular daily activities without incident.  If any biopsies were taken you will be contacted by phone or by letter within the next 1-3 weeks.  Please call us at [phone] if you have not heard about the biopsies in 3 weeks.    SIGNATURES/CONFIDENTIALITY: You and/or your care partner have signed paperwork which will be entered into your electronic medical record.  These signatures attest to the fact that that the information above on your After Visit Summary has been reviewed and is understood.  Full responsibility of  the confidentiality of this discharge information lies with you and/or your care-partner.   Information on polyps and diverticulosis given to you today

## 2016-11-29 NOTE — Progress Notes (Signed)
Called to room to assist during endoscopic procedure.  Patient ID and intended procedure confirmed with present staff. Received instructions for my participation in the procedure from the performing physician.  

## 2016-11-29 NOTE — Progress Notes (Signed)
Pt's states no medical or surgical changes since previsit or office visit. 

## 2016-11-29 NOTE — Op Note (Signed)
[place] Patient Name: Linda Werner Procedure Date: 11/29/2016 11:14 AM MRN: [id number] Endoscopist: Gatha Mayer , MD Age: 76 Referring MD:  Date of Birth: 1941-06-28 Gender: Female Account #: [id number] Procedure:                Colonoscopy Indications:              Screening for colorectal malignant neoplasm, Last                            colonoscopy: 2007 Medicines:                Propofol per Anesthesia, Monitored Anesthesia Care Procedure:                Pre-Anesthesia Assessment:                           - Prior to the procedure, a History and Physical                            was performed, and patient medications and                            allergies were reviewed. The patient's tolerance of                            previous anesthesia was also reviewed. The risks                            and benefits of the procedure and the sedation                            options and risks were discussed with the patient.                            All questions were answered, and informed consent                            was obtained. Prior Anticoagulants: The patient has                            taken no previous anticoagulant or antiplatelet                            agents. ASA Grade Assessment: II - A patient with                            mild systemic disease. After reviewing the risks                            and benefits, the patient was deemed in                            satisfactory condition to undergo the procedure.  After obtaining informed consent, the colonoscope                            was passed under direct vision. Throughout the                            procedure, the patient's blood pressure, pulse, and                            oxygen saturations were monitored continuously. The                            Model CF-HQ190L [id number]) scope was introduced                            through the  anus and advanced to the the cecum,                            identified by appendiceal orifice and ileocecal                            valve. The colonoscopy was performed without                            difficulty. The patient tolerated the procedure                            well. The quality of the bowel preparation was                            good. The ileocecal valve, appendiceal orifice, and                            rectum were photographed. Scope In: 11:24:47 AM Scope Out: 11:41:41 AM Scope Withdrawal Time: 0 hours 11 minutes 36 seconds  Total Procedure Duration: 0 hours 16 minutes 54 seconds  Findings:                 The perianal and digital rectal examinations were                            normal.                           A diminutive polyp was found in the ascending                            colon. The polyp was sessile. The polyp was removed                            with a cold snare. Resection and retrieval were                            complete. Verification of patient identification  for the specimen was done. Estimated blood loss was                            minimal.                           Multiple small and large-mouthed diverticula were                            found in the sigmoid colon.                           The exam was otherwise without abnormality on                            direct and retroflexion views. Complications:            No immediate complications. Estimated Blood Loss:     Estimated blood loss was minimal. Impression:               - One diminutive polyp in the ascending colon,                            removed with a cold snare. Resected and retrieved.                           - Diverticulosis in the sigmoid colon.                           - The examination was otherwise normal on direct                            and retroflexion views. Recommendation:           - Patient has a contact  number available for                            emergencies. The signs and symptoms of potential                            delayed complications were discussed with the                            patient. Return to normal activities tomorrow.                            Written discharge instructions were provided to the                            patient.                           - Resume previous diet.                           - Continue present medications.                           -  No repeat colonoscopy due to age. Gatha Mayer, MD 11/29/2016 11:55:14 AM This report has been signed electronically.

## 2016-11-29 NOTE — Progress Notes (Signed)
Report to PACU, RN, vss, BBS= Clear.  

## 2016-12-02 ENCOUNTER — Telehealth: Payer: Self-pay | Admitting: *Deleted

## 2016-12-02 NOTE — Telephone Encounter (Signed)
  Follow up Call-  Call back number 11/29/2016  Post procedure Call Back phone  # [phone]  Permission to leave phone message Yes  Some recent data might be hidden     Patient questions:  Do you have a fever, pain , or abdominal swelling? No. Pain Score  0 *  Have you tolerated food without any problems? Yes.    Have you been able to return to your normal activities? Yes.    Do you have any questions about your discharge instructions: Diet   No. Medications  No. Follow up visit  No.  Do you have questions or concerns about your Care? No.  Actions: * If pain score is 4 or above: No action needed, pain <4.

## 2016-12-04 ENCOUNTER — Encounter: Payer: Self-pay | Admitting: Internal Medicine

## 2016-12-04 DIAGNOSIS — Z860101 Personal history of adenomatous and serrated colon polyps: Secondary | ICD-10-CM | POA: Insufficient documentation

## 2016-12-04 DIAGNOSIS — Z86010 Personal history of colonic polyps: Secondary | ICD-10-CM

## 2016-12-04 HISTORY — DX: Personal history of colonic polyps: Z86.010

## 2016-12-04 HISTORY — DX: Personal history of adenomatous and serrated colon polyps: Z86.0101

## 2016-12-04 NOTE — Progress Notes (Signed)
Diminutive adenoma No recall - age My chart letter sent

## 2016-12-09 ENCOUNTER — Ambulatory Visit (INDEPENDENT_AMBULATORY_CARE_PROVIDER_SITE_OTHER): Payer: Medicare HMO | Admitting: Family Medicine

## 2016-12-09 ENCOUNTER — Encounter: Payer: Self-pay | Admitting: Family Medicine

## 2016-12-09 VITALS — BP 138/80 | HR 82 | Ht 61.0 in | Wt 145.0 lb

## 2016-12-09 DIAGNOSIS — E782 Mixed hyperlipidemia: Secondary | ICD-10-CM

## 2016-12-09 DIAGNOSIS — R7301 Impaired fasting glucose: Secondary | ICD-10-CM | POA: Diagnosis not present

## 2016-12-09 DIAGNOSIS — Z23 Encounter for immunization: Secondary | ICD-10-CM

## 2016-12-09 DIAGNOSIS — I10 Essential (primary) hypertension: Secondary | ICD-10-CM

## 2016-12-09 LAB — LIPID PANEL W/REFLEX DIRECT LDL
CHOL/HDL RATIO: 4 ratio (ref <5.0)
Cholesterol: 208 mg/dL — ABNORMAL HIGH (ref <200)
HDL: 52 mg/dL (ref >50)
LDL-Cholesterol: 131 mg/dL — ABNORMAL HIGH
NON-HDL CHOLESTEROL (CALC): 156 mg/dL — AB (ref <130)
TRIGLYCERIDES: 137 mg/dL (ref <150)

## 2016-12-09 LAB — COMPLETE METABOLIC PANEL WITH GFR
ALBUMIN: 4.3 g/dL (ref 3.6–5.1)
ALK PHOS: 78 U/L (ref 33–130)
ALT: 22 U/L (ref 6–29)
AST: 23 U/L (ref 10–35)
BUN: 16 mg/dL (ref 7–25)
CALCIUM: 9.3 mg/dL (ref 8.6–10.4)
CHLORIDE: 108 mmol/L (ref 98–110)
CO2: 26 mmol/L (ref 20–31)
Creat: 0.95 mg/dL — ABNORMAL HIGH (ref 0.60–0.93)
GFR, EST NON AFRICAN AMERICAN: 60 mL/min (ref >=60)
GFR, Est African American: 69 mL/min (ref >=60)
Glucose, Bld: 112 mg/dL — ABNORMAL HIGH (ref 65–99)
POTASSIUM: 4 mmol/L (ref 3.5–5.3)
Sodium: 142 mmol/L (ref 135–146)
Total Bilirubin: 0.6 mg/dL (ref 0.2–1.2)
Total Protein: 6.9 g/dL (ref 6.1–8.1)

## 2016-12-09 LAB — POCT GLYCOSYLATED HEMOGLOBIN (HGB A1C): Hemoglobin A1C: 6.1

## 2016-12-09 NOTE — Progress Notes (Signed)
Subjective:     CC: HTN  HPI: She did do a [employer] screening 3 novant recently. She said she got good results on that.  Hypertension- Pt denies chest pain, SOB, dizziness, or heart palpitations.  Taking meds as directed w/o problems.  Denies medication side effects.  Though, she says she actually cut her amlodipine and her losartan both in half because her blood pressures were running a little bit low.  IFG - no inc thirst or urination.   Hyperlipidemia - not currently on medication for her cholesterol. Lab Results  Component Value Date   CHOL 198 12/22/2015   HDL 52 12/22/2015   LDLCALC 109 12/22/2015   LDLDIRECT 162 (H) 03/22/2009   TRIG 184 (H) 12/22/2015   CHOLHDL 3.8 12/22/2015     Past medical history, Surgical history, Family history not pertinant except as noted below, Social history, Allergies, and medications have been entered into the medical record, reviewed, and corrections made.   Review of Systems: No fevers, chills, night sweats, weight loss, chest pain, or shortness of breath.   Objective:    General: Well Developed, well nourished, and in no acute distress.  Neuro: Alert and oriented x3, extra-ocular muscles intact, sensation grossly intact.  HEENT: Normocephalic, atraumatic  Skin: Warm and dry, no rashes. Cardiac: Regular rate and rhythm, no murmurs rubs or gallops, no lower extremity edema.  Respiratory: Clear to auscultation bilaterally. Not using accessory muscles, speaking in full sentences.   Impression and Recommendations:    HTN - Well controlled, though borderline. Increase losartan to whole tab and Ok to keep the amlodipine to half a tab. Continue current regimen. Follow up in    IFG - Well controlled. Hemoglobin A1c 6.1 today which is down slightly from previous of 6.2. Continue work on [employer] regular exercise and weight loss.  Hyperlipidemia - Due to repeat lipids.  Prevnar 13 given today.

## 2016-12-09 NOTE — Addendum Note (Signed)
Addended by: Teddy Spike on: 12/09/2016 11:51 AM   Modules accepted: Orders

## 2016-12-09 NOTE — Patient Instructions (Signed)
Try taking whole losartan and half the amlodipine. I would like to see BP in the 120 to low 130s.

## 2016-12-13 ENCOUNTER — Encounter: Payer: Self-pay | Admitting: Family Medicine

## 2016-12-13 DIAGNOSIS — Z803 Family history of malignant neoplasm of breast: Secondary | ICD-10-CM | POA: Diagnosis not present

## 2016-12-13 DIAGNOSIS — Z1231 Encounter for screening mammogram for malignant neoplasm of breast: Secondary | ICD-10-CM | POA: Diagnosis not present

## 2017-02-10 ENCOUNTER — Telehealth: Payer: Self-pay

## 2017-02-10 MED ORDER — RISEDRONATE SODIUM 35 MG PO TABS: 35.0000 mg | ORAL_TABLET | ORAL | 3 refills | Status: DC

## 2017-02-10 NOTE — Telephone Encounter (Signed)
Ok, no problem. New rx sent.

## 2017-02-10 NOTE — Telephone Encounter (Signed)
Pt called stating that she would like a rx for risedronate 35 MG tablet sent to [facility] due cost savings. She now takes  risedronate 30 MG, [facility] has 35 MG and she would like to know if this change is possible. Please advise.

## 2017-02-11 NOTE — Telephone Encounter (Signed)
Left detailed vm advising pt

## 2017-02-20 ENCOUNTER — Other Ambulatory Visit: Payer: Self-pay | Admitting: *Deleted

## 2017-02-20 MED ORDER — RISEDRONATE SODIUM 35 MG PO TABS: 35.0000 mg | ORAL_TABLET | ORAL | 3 refills | Status: DC

## 2017-05-20 ENCOUNTER — Ambulatory Visit (INDEPENDENT_AMBULATORY_CARE_PROVIDER_SITE_OTHER): Payer: Medicare HMO | Admitting: Osteopathic Medicine

## 2017-05-20 ENCOUNTER — Encounter: Payer: Self-pay | Admitting: Osteopathic Medicine

## 2017-05-20 VITALS — BP 130/81 | HR 70 | Temp 97.7°F | Wt 146.0 lb

## 2017-05-20 DIAGNOSIS — R300 Dysuria: Secondary | ICD-10-CM | POA: Diagnosis not present

## 2017-05-20 DIAGNOSIS — N3090 Cystitis, unspecified without hematuria: Secondary | ICD-10-CM | POA: Diagnosis not present

## 2017-05-20 DIAGNOSIS — N898 Other specified noninflammatory disorders of vagina: Secondary | ICD-10-CM

## 2017-05-20 LAB — POCT URINALYSIS DIPSTICK
BILIRUBIN UA: NEGATIVE
GLUCOSE UA: NEGATIVE
Ketones, UA: NEGATIVE
NITRITE UA: NEGATIVE
PH UA: 7 (ref 5.0–8.0)
Protein, UA: NEGATIVE
Spec Grav, UA: 1.02 (ref 1.010–1.025)
Urobilinogen, UA: 0.2 E.U./dL

## 2017-05-20 MED ORDER — NITROFURANTOIN MONOHYD MACRO 100 MG PO CAPS: 100.0000 mg | ORAL_CAPSULE | Freq: Two times a day (BID) | ORAL | 0 refills | Status: DC

## 2017-05-20 MED ORDER — METRONIDAZOLE 500 MG PO TABS: 500.0000 mg | ORAL_TABLET | Freq: Two times a day (BID) | ORAL | 0 refills | Status: AC

## 2017-05-20 NOTE — Progress Notes (Signed)
HPI: Linda Werner is a 76 y.o. female  who presents to [facility] today, 05/20/17,  for chief complaint of:  Chief Complaint  Patient presents with  . Dysuria   . Location/Quality: Urinary frequency and dysuria . Duration: 1 week . Modifying factors: Monistat tried for discharge  . Assoc signs/symptoms: +Slight yellowish vaginal discharge, no fever, no nausea, no confusion  No previous urine cultures in our Epic system or on review of care everywhere.  History of impaired fasting glucose but no diabetes diagnosis, CKD3. Last creatinine 0.95, last GFR 69    Past medical history, surgical history, social history and family history reviewed.  Patient Active Problem List   Diagnosis Date Noted  . Hx of adenomatous polyp of colon 12/04/2016  . Systolic murmur 42/70/6237  . Cataract, senile 09/02/2012  . CKD (chronic kidney disease) stage 3, GFR 30-59 ml/min 03/22/2009  . VITILIGO 03/22/2009  . IMPAIRED FASTING GLUCOSE 03/22/2009  . Hyperlipemia 09/01/2008  . ESSENTIAL HYPERTENSION, BENIGN 09/01/2008  . Osteoporosis 09/01/2008    Current medication list and allergy/intolerance information reviewed.   Current Outpatient Prescriptions on File Prior to Visit  Medication Sig Dispense Refill  . amLODipine (NORVASC) 10 MG tablet Take 1 tablet (10 mg total) by mouth daily. 90 tablet 1  . calcium carbonate (OS-CAL) 600 MG TABS Take 600 mg by mouth 2 (two) times daily with a meal.    . losartan (COZAAR) 100 MG tablet Take 1 tablet (100 mg total) by mouth daily. 90 tablet 1  . Multiple Vitamins-Minerals (CENTRUM SILVER PO) Take 1 tablet by mouth once.    . risedronate (ACTONEL) 35 MG tablet Take 1 tablet (35 mg total) by mouth every 7 (seven) days. with water on empty stomach, nothing by mouth or lie down for next 30 minutes. She has tried and failed alendronate. 3 tablet 3  . Vitamin D, Cholecalciferol, 400 UNITS CHEW Chew by mouth.     Current  Facility-Administered Medications on File Prior to Visit  Medication Dose Route Frequency Provider Last Rate Last Dose  . 0.9 %  sodium chloride infusion  500 mL Intravenous Continuous Gatha Mayer, MD       Allergies  Allergen Reactions  . Alendronate Sodium     REACTION: rash  . Atorvastatin Other (See Comments)    Myalgias.    . Benazepril Cough  . Statins     REACTION: myalgias  . Verapamil Hcl     REACTION: swelling      Review of Systems:  Constitutional: No recent illness, no fever/chills  Cardiac: No  chest pain  Respiratory:  No  shortness of breath  Gastrointestinal: No  abdominal pain, no change on bowel habits  Musculoskeletal: No new myalgia/arthralgia  Neurologic: No  weakness  Exam:  BP 130/81   Pulse 70   Temp 97.7 F (36.5 C) (Oral)   Wt 146 lb (66.2 kg)   BMI 27.59 kg/m   Constitutional: VS see above. General Appearance: alert, well-developed, well-nourished, NAD  Eyes: Normal lids and conjunctive, non-icteric sclera  Ears, Nose, Mouth, Throat: MMM, Normal external inspection ears/nares/mouth/lips/gums.  Neck: No masses, trachea midline.   Respiratory: Normal respiratory effort.   Musculoskeletal: Gait normal. Symmetric and independent movement of all extremities  Neurological: Normal balance/coordination. No tremor.  Skin: warm, dry, intact.   Psychiatric: Normal judgment/insight. Normal mood and affect. Oriented x3.    Recent Results (from the past 2160 hour(s))  POCT Urinalysis Dipstick  Status: Abnormal   Collection Time: 05/20/17  7:41 AM  Result Value Ref Range   Color, UA light yellow    Clarity, UA cloudy    Glucose, UA negative    Bilirubin, UA negative    Ketones, UA negative    Spec Grav, UA 1.020 1.010 - 1.025   Blood, UA tace    pH, UA 7.0 5.0 - 8.0   Protein, UA negative    Urobilinogen, UA 0.2 0.2 or 1.0 E.U./dL   Nitrite, UA negative    Leukocytes, UA Large (3+) (A) Negative    ASSESSMENT/PLAN: We'll  treat for UTI and bacterial vaginosis, return to clinic for pelvic exam if symptoms do not improve, will call with urine culture results when these are available  Cystitis - Plan: Urine Culture, nitrofurantoin, macrocrystal-monohydrate, (MACROBID) 100 MG capsule  Dysuria - Plan: POCT Urinalysis Dipstick, Urine Culture  Vaginal discharge - Plan: metroNIDAZOLE (FLAGYL) 500 MG tablet    Follow-up plan: Return if symptoms worsen or fail to improve.  Visit summary with medication list and pertinent instructions was printed for patient to review, alert [place] if any changes needed. All questions at time of visit were answered - patient instructed to contact office with any additional concerns. ER/RTC precautions were reviewed with the patient and understanding verbalized.

## 2017-05-23 LAB — URINE CULTURE

## 2017-06-11 ENCOUNTER — Other Ambulatory Visit: Payer: Self-pay | Admitting: Family Medicine

## 2017-07-06 ENCOUNTER — Other Ambulatory Visit: Payer: Self-pay | Admitting: Osteopathic Medicine

## 2017-07-06 DIAGNOSIS — N3090 Cystitis, unspecified without hematuria: Secondary | ICD-10-CM

## 2017-07-08 ENCOUNTER — Other Ambulatory Visit: Payer: Self-pay | Admitting: *Deleted

## 2017-07-08 MED ORDER — RISEDRONATE SODIUM 30 MG PO TABS: 30.0000 mg | ORAL_TABLET | ORAL | 3 refills | Status: DC

## 2017-08-25 ENCOUNTER — Ambulatory Visit (INDEPENDENT_AMBULATORY_CARE_PROVIDER_SITE_OTHER): Payer: Medicare HMO | Admitting: Family Medicine

## 2017-08-25 DIAGNOSIS — Z23 Encounter for immunization: Secondary | ICD-10-CM

## 2017-10-09 ENCOUNTER — Ambulatory Visit: Payer: Medicare HMO | Admitting: Osteopathic Medicine

## 2017-10-09 ENCOUNTER — Encounter: Payer: Self-pay | Admitting: Osteopathic Medicine

## 2017-10-09 VITALS — BP 152/87 | HR 59 | Temp 97.5°F | Wt 143.9 lb

## 2017-10-09 DIAGNOSIS — R21 Rash and other nonspecific skin eruption: Secondary | ICD-10-CM

## 2017-10-09 MED ORDER — BETAMETHASONE DIPROPIONATE 0.05 % EX CREA: TOPICAL_CREAM | Freq: Two times a day (BID) | CUTANEOUS | 0 refills | Status: DC

## 2017-10-09 NOTE — Patient Instructions (Signed)
I suspect allergy to something you've come into contact with, or possible insect bite. If cream isn't helping, let us know and would recommend recheck with Dr. Madilyn Fireman at that point or we can try to get you in with a dermatologist

## 2017-10-09 NOTE — Progress Notes (Signed)
HPI: Linda Werner is a 76 y.o. female who  has a past medical history of Allergy, Anxiety, Arthritis (not diagnosed), Cataract, CKD (chronic kidney disease) stage 3, GFR 30-59 ml/min, Heart murmur (last visit), adenomatous polyp of colon (12/04/2016), Hypercholesteremia, Hypertension, Neuromuscular disorder ([place]) (not diagnosed), and Osteoporosis.  she presents to [facility] today, 10/09/17,  for chief complaint of:  Chief Complaint  Patient presents with  . Rash    Rash on face, red welts. Started on R chest but this has resolved, started 2 weeks ago. Now on face and arm. See photos. Itchy.    HTN: BP elevated. Hasn't taken medications today. No CP/SOB.  BP better on recheck.     Past medical, surgical, social and family history reviewed:  Patient Active Problem List   Diagnosis Date Noted  . Hx of adenomatous polyp of colon 12/04/2016  . Systolic murmur 16/07/9603  . Cataract, senile 09/02/2012  . CKD (chronic kidney disease) stage 3, GFR 30-59 ml/min (HCC) 03/22/2009  . VITILIGO 03/22/2009  . IMPAIRED FASTING GLUCOSE 03/22/2009  . Hyperlipemia 09/01/2008  . ESSENTIAL HYPERTENSION, BENIGN 09/01/2008  . Osteoporosis 09/01/2008    Past Surgical History:  Procedure Laterality Date  . ABDOMINAL HYSTERECTOMY    . BREAST BIOPSY  2011   left  . COLONOSCOPY      Social History   Tobacco Use  . Smoking status: Never Smoker  . Smokeless tobacco: Never Used  Substance Use Topics  . Alcohol use: No    Family History  Problem Relation Age of Onset  . Hypertension Unknown   . Arthritis Sister   . Cancer Sister   . Early death Daughter   . Colon cancer Neg Hx      Current medication list and allergy/intolerance information reviewed:    Current Outpatient Medications  Medication Sig Dispense Refill  . amLODipine (NORVASC) 10 MG tablet TAKE 1 TABLET (10 MG TOTAL) BY MOUTH DAILY. 90 tablet 1  . calcium carbonate (OS-CAL) 600 MG  TABS Take 600 mg by mouth 2 (two) times daily with a meal.    . losartan (COZAAR) 100 MG tablet TAKE 1 TABLET (100 MG TOTAL) BY MOUTH DAILY. 90 tablet 1  . Multiple Vitamins-Minerals (CENTRUM SILVER PO) Take 1 tablet by mouth once.    . risedronate (ACTONEL) 30 MG tablet Take 1 tablet (30 mg total) by mouth every 7 (seven) days. with water on empty stomach, nothing by mouth or lie down for next 30 minutes. She has tried and failed alendronate. 36 tablet 3  . Vitamin D, Cholecalciferol, 400 UNITS CHEW Chew by mouth.     No current facility-administered medications for this visit.     Allergies  Allergen Reactions  . Alendronate Sodium     REACTION: rash  . Atorvastatin Other (See Comments)    Myalgias.    . Benazepril Cough  . Statins     REACTION: myalgias  . Verapamil Hcl     REACTION: swelling      Review of Systems:  Constitutional:  No  fever, no chills  Cardiac: No  chest pain  Respiratory:  No  shortness of breath. No  Cough  Musculoskeletal: No new myalgia/arthralgia  Skin: +Rash, No other wounds/concerning lesions   Exam:  BP (!) 152/87   Pulse (!) 59   Temp (!) 97.5 F (36.4 C) (Oral)   Wt 143 lb 14.4 oz (65.3 kg)   BMI 27.19 kg/m    Constitutional: VS  see above. General Appearance: alert, well-developed, well-nourished, NAD  Eyes: Normal lids and conjunctive, non-icteric sclera  Ears, Nose, Mouth, Throat: MMM, Normal external inspection ears/nares/mouth/lips/gums.   Neck: No masses, trachea midline.   Musculoskeletal: Gait normal.   Skin: warm, dry, intact. +macular erythematous patches, slight edema, 3 on face, 1 on L forearm, no ulceration/drainage. No other rash/ulcer. No concerning nevi or subq nodules on limited exam.    Psychiatric: Normal judgment/insight. Normal mood and affect. Oriented x3.              ASSESSMENT/PLAN:   Rash and nonspecific skin eruption - appears allergic, suspect bites of some kind, no hand involvement to  suggest scabies, no trauma. Bx/Derm is no better. Trial mid-potency steroids, avoid allerge   Meds ordered this encounter  Medications  . betamethasone dipropionate (DIPROLENE) 0.05 % cream    Sig: Apply topically 2 (two) times daily. To affected area(s) as needed for up to two weeks    Dispense:  45 g    Refill:  0    Patient Instructions  I suspect allergy to something you've come into contact with, or possible insect bite. If cream isn't helping, let us know and would recommend recheck with Dr. Madilyn Fireman at that point or we can try to get you in with a dermatologist     Visit summary with medication list and pertinent instructions was printed for patient to review. All questions at time of visit were answered - patient instructed to contact office with any additional concerns. ER/RTC precautions were reviewed with the patient.   Follow-up plan: Return if symptoms worsen or fail to improve.    Please note: voice recognition software was used to produce this document, and typos may escape review. Please contact Dr. Sheppard Coil for any needed clarifications.

## 2017-10-27 ENCOUNTER — Ambulatory Visit (INDEPENDENT_AMBULATORY_CARE_PROVIDER_SITE_OTHER): Payer: Medicare HMO | Admitting: Family Medicine

## 2017-10-27 ENCOUNTER — Encounter: Payer: Self-pay | Admitting: Family Medicine

## 2017-10-27 VITALS — BP 138/78 | HR 65 | Ht 61.0 in | Wt 142.0 lb

## 2017-10-27 DIAGNOSIS — R7301 Impaired fasting glucose: Secondary | ICD-10-CM | POA: Diagnosis not present

## 2017-10-27 DIAGNOSIS — M545 Low back pain: Secondary | ICD-10-CM

## 2017-10-27 DIAGNOSIS — E782 Mixed hyperlipidemia: Secondary | ICD-10-CM

## 2017-10-27 DIAGNOSIS — I10 Essential (primary) hypertension: Secondary | ICD-10-CM

## 2017-10-27 DIAGNOSIS — G8929 Other chronic pain: Secondary | ICD-10-CM | POA: Diagnosis not present

## 2017-10-27 DIAGNOSIS — N183 Chronic kidney disease, stage 3 unspecified: Secondary | ICD-10-CM

## 2017-10-27 LAB — POCT GLYCOSYLATED HEMOGLOBIN (HGB A1C): Hemoglobin A1C: 5.9

## 2017-10-27 NOTE — Progress Notes (Signed)
Subjective:    CC:Bp check and new onset back pain   HPI:  Hypertension- Pt denies chest pain, SOB, dizziness, or heart palpitations.  Taking meds as directed w/o problems.  Denies medication side effects.    Impaired fasting glucose-no increased thirst or urination. No symptoms consistent with hypoglycemia.  Also complains of low back pain that starts in the midline.  It does not radiate down into the legs.  She says it started last February at that time it was more severe pain but now it just bothers her occasionally.  She will notice a little stiffness and soreness when she first wakes up but then when she gets moving it actually feels better.  She has been using salon Paz patches and rarely will use Tylenol or medication over-the-counter.  CKD 3 -no acute urinary changes or kidney problems.  She avoids NSAIDs for the most part.   Past medical history, Surgical history, Family history not pertinant except as noted below, Social history, Allergies, and medications have been entered into the medical record, reviewed, and corrections made.   Review of Systems: No fevers, chills, night sweats, weight loss, chest pain, or shortness of breath.   Objective:    General: Well Developed, well nourished, and in no acute distress.  Neuro: Alert and oriented x3, extra-ocular muscles intact, sensation grossly intact.  HEENT: Normocephalic, atraumatic  Skin: Warm and dry, no rashes. Cardiac: Regular rate and rhythm, no murmurs rubs or gallops, no lower extremity edema.  Respiratory: Clear to auscultation bilaterally. Not using accessory muscles, speaking in full sentences.   Impression and Recommendations:    HTN - Well controlled. Continue current regimen. Follow up in  6 months.   IFG - Improved.  A1C of 5.9 today. F/U in 6 months.   CKD 3 - Due to recheck levels.   Hyperlipidemia - due to recheck for yearly.   Low back pain midline - OK to hold the Actonel for a few months and will get  xray today. Will call with results. Given H.O with exercise.

## 2017-10-27 NOTE — Patient Instructions (Addendum)

## 2017-10-29 ENCOUNTER — Ambulatory Visit (INDEPENDENT_AMBULATORY_CARE_PROVIDER_SITE_OTHER): Payer: Medicare HMO

## 2017-10-29 DIAGNOSIS — I700 Atherosclerosis of aorta: Secondary | ICD-10-CM

## 2017-10-29 DIAGNOSIS — G8929 Other chronic pain: Secondary | ICD-10-CM

## 2017-10-29 DIAGNOSIS — R7301 Impaired fasting glucose: Secondary | ICD-10-CM | POA: Diagnosis not present

## 2017-10-29 DIAGNOSIS — M48061 Spinal stenosis, lumbar region without neurogenic claudication: Secondary | ICD-10-CM

## 2017-10-29 DIAGNOSIS — E782 Mixed hyperlipidemia: Secondary | ICD-10-CM | POA: Diagnosis not present

## 2017-10-29 DIAGNOSIS — I10 Essential (primary) hypertension: Secondary | ICD-10-CM | POA: Diagnosis not present

## 2017-10-29 DIAGNOSIS — M545 Low back pain: Principal | ICD-10-CM

## 2017-10-29 DIAGNOSIS — N183 Chronic kidney disease, stage 3 (moderate): Secondary | ICD-10-CM | POA: Diagnosis not present

## 2017-10-30 LAB — COMPLETE METABOLIC PANEL WITH GFR
AG Ratio: 1.5 (calc) (ref 1.0–2.5)
ALBUMIN MSPROF: 4.3 g/dL (ref 3.6–5.1)
ALKALINE PHOSPHATASE (APISO): 84 U/L (ref 33–130)
ALT: 21 U/L (ref 6–29)
AST: 24 U/L (ref 10–35)
BUN: 14 mg/dL (ref 7–25)
CO2: 28 mmol/L (ref 20–32)
Calcium: 9.6 mg/dL (ref 8.6–10.4)
Chloride: 105 mmol/L (ref 98–110)
Creat: 0.9 mg/dL (ref 0.60–0.93)
GFR, EST AFRICAN AMERICAN: 73 mL/min/{1.73_m2} (ref >=60)
GFR, Est Non African American: 63 mL/min/{1.73_m2} (ref >=60)
GLOBULIN: 2.8 g/dL (ref 1.9–3.7)
GLUCOSE: 98 mg/dL (ref 65–99)
Potassium: 4.1 mmol/L (ref 3.5–5.3)
SODIUM: 141 mmol/L (ref 135–146)
TOTAL PROTEIN: 7.1 g/dL (ref 6.1–8.1)
Total Bilirubin: 0.8 mg/dL (ref 0.2–1.2)

## 2017-10-30 LAB — LIPID PANEL
CHOLESTEROL: 236 mg/dL — AB (ref <200)
HDL: 54 mg/dL (ref >50)
LDL CHOLESTEROL (CALC): 148 mg/dL — AB
Non-HDL Cholesterol (Calc): 182 mg/dL (calc) — ABNORMAL HIGH (ref <130)
Total CHOL/HDL Ratio: 4.4 (calc) (ref <5.0)
Triglycerides: 195 mg/dL — ABNORMAL HIGH (ref <150)

## 2017-12-19 ENCOUNTER — Encounter: Payer: Self-pay | Admitting: Family Medicine

## 2017-12-19 DIAGNOSIS — Z1231 Encounter for screening mammogram for malignant neoplasm of breast: Secondary | ICD-10-CM | POA: Diagnosis not present

## 2017-12-19 DIAGNOSIS — M8589 Other specified disorders of bone density and structure, multiple sites: Secondary | ICD-10-CM | POA: Diagnosis not present

## 2017-12-19 DIAGNOSIS — Z803 Family history of malignant neoplasm of breast: Secondary | ICD-10-CM | POA: Diagnosis not present

## 2017-12-22 ENCOUNTER — Encounter: Payer: Self-pay | Admitting: Family Medicine

## 2017-12-22 DIAGNOSIS — H524 Presbyopia: Secondary | ICD-10-CM | POA: Diagnosis not present

## 2017-12-22 DIAGNOSIS — H52209 Unspecified astigmatism, unspecified eye: Secondary | ICD-10-CM | POA: Diagnosis not present

## 2017-12-22 DIAGNOSIS — H5203 Hypermetropia, bilateral: Secondary | ICD-10-CM | POA: Diagnosis not present

## 2017-12-29 ENCOUNTER — Telehealth: Payer: Self-pay | Admitting: Family Medicine

## 2017-12-29 NOTE — Telephone Encounter (Signed)
Call pt:  Bone density looks better. T  score is -2.2.Recommend repeat in 3-5 years.

## 2017-12-30 NOTE — Telephone Encounter (Signed)
Pt advised of results, verbalized understanding. No further questions.  

## 2018-03-06 ENCOUNTER — Encounter: Payer: Self-pay | Admitting: Family Medicine

## 2018-03-12 ENCOUNTER — Other Ambulatory Visit: Payer: Self-pay | Admitting: *Deleted

## 2018-03-12 MED ORDER — LOSARTAN POTASSIUM 100 MG PO TABS: 100.0000 mg | ORAL_TABLET | Freq: Every day | ORAL | 1 refills | Status: DC

## 2018-03-12 MED ORDER — AMLODIPINE BESYLATE 10 MG PO TABS: 10.0000 mg | ORAL_TABLET | Freq: Every day | ORAL | 1 refills | Status: DC

## 2018-04-30 ENCOUNTER — Ambulatory Visit: Payer: Medicare HMO | Admitting: Family Medicine

## 2018-06-04 ENCOUNTER — Encounter: Payer: Self-pay | Admitting: Family Medicine

## 2018-06-18 ENCOUNTER — Encounter: Payer: Self-pay | Admitting: Family Medicine

## 2018-06-18 ENCOUNTER — Ambulatory Visit (INDEPENDENT_AMBULATORY_CARE_PROVIDER_SITE_OTHER): Payer: Medicare HMO | Admitting: Family Medicine

## 2018-06-18 VITALS — BP 120/64 | HR 84 | Ht 61.0 in | Wt 142.0 lb

## 2018-06-18 DIAGNOSIS — R208 Other disturbances of skin sensation: Secondary | ICD-10-CM | POA: Diagnosis not present

## 2018-06-18 DIAGNOSIS — Z23 Encounter for immunization: Secondary | ICD-10-CM | POA: Diagnosis not present

## 2018-06-18 DIAGNOSIS — N183 Chronic kidney disease, stage 3 unspecified: Secondary | ICD-10-CM

## 2018-06-18 DIAGNOSIS — I10 Essential (primary) hypertension: Secondary | ICD-10-CM

## 2018-06-18 DIAGNOSIS — R7301 Impaired fasting glucose: Secondary | ICD-10-CM

## 2018-06-18 LAB — POCT GLYCOSYLATED HEMOGLOBIN (HGB A1C): HEMOGLOBIN A1C: 5.7 % — AB (ref 4.0–5.6)

## 2018-06-18 NOTE — Progress Notes (Signed)
Subjective:    CC:   HPI:  Hypertension- Pt denies chest pain, SOB, dizziness, or heart palpitations.  Taking meds as directed w/o problems.  Denies medication side effects.    Impaired fasting glucose-no increased thirst or urination. No symptoms consistent with hypoglycemia.  F/U CKD 3 - no change in urinattion.   He also has several concerns today.  She feels her right arm is larger than left.  To her it is noticeably larger.  She is right-handed.  She is also noticed that the veins on her forearms to count a little bit more compared to her left.  She denies any injury or trauma.  Quite some time ago she had actually complained of a burning sensation on her left hand going up to her elbow and she says eventually it actually moved over to her right.  She does from time to time get a burning sensation over the right outer elbow area but not so much over the hand.  She also feels like her right arm is a lot longer than her left.  She also feels like sometimes she will notice she is leaning to the right.  Most like something is drawing her in that direction.  She denies any neck pain.  No vision changes.  No headaches or taste in vision or hearing.  No recent upper respiratory infections.  No ear pain.  No room spinning.  She also complains of some left ankle swelling.  She says when it swells it usually over the lateral ankle.  She did sprain it a couple of years ago but no recent injury.  She says sometimes when she goes to stand up she will feel like the foot just wants to give out her Foley leg gets weak and not supporting her.  She says she also missed her medication for couple days and felt like her pulse became extremely low.  She would like to have an EKG done.  Past medical history, Surgical history, Family history not pertinant except as noted below, Social history, Allergies, and medications have been entered into the medical record, reviewed, and corrections made.   Review of Systems:  No fevers, chills, night sweats, weight loss, chest pain, or shortness of breath.   Objective:    Physical Exam  Constitutional: She is oriented to person, place, and time. She appears well-developed and well-nourished.  HENT:  Head: Normocephalic and atraumatic.  Right Ear: External ear normal.  Left Ear: External ear normal.  Nose: Nose normal.  Mouth/Throat: Oropharynx is clear and moist.  TMs and canals are clear.   Eyes: Pupils are equal, round, and reactive to light. Conjunctivae and EOM are normal.  Neck: Neck supple. No thyromegaly present.  Cardiovascular: Normal rate, regular rhythm and normal heart sounds.  Pulmonary/Chest: Effort normal and breath sounds normal. She has no wheezes.  Abdominal: Soft. Bowel sounds are normal. She exhibits no distension and no mass. There is no tenderness. There is no rebound and no guarding.  Lymphadenopathy:    She has no cervical adenopathy.  Neurological: She is alert and oriented to person, place, and time. No cranial nerve deficit. Coordination normal.  Skin: Skin is warm and dry.  Psychiatric: She has a normal mood and affect. Her behavior is normal.      Impression and Recommendations:    HTN - Well controlled. Continue current regimen. Follow up in  6 months.    IFG - A1C is 5.7.  Well controlled. Continue current regimen. Follow  up in  6 months.    CKD 3 - recheck renal function.    Left ankle swelling - likely secondary trauma a couple of year ago. Can cause asymmetric swelling.    Right arm bigger. No swelling in the extremities.  I do see that her bicep is a little bit larger on the right compared to the left but I think that is because she is right-hand dominant.  I do not see a significant difference that looks worrisome no mass or lumps.  She does have a few distended blood vessels on the distal right arm compared to the left.  She denies any axillary lumps or breast issues.  Mammogram was done in February  2018.  Burning Sensation right arm-she denies any cervical or neck pain or old injury.  Could consider further work-up with nerve conduction testing especially if lab work comes back normal.  Slow pulse-today her heart rate is in the normal range.  EKG performed.  EKG shows rate of 64 bpm, normal sinus rhythm with sinus arrhythmia.  No acute ST-T wave changes.

## 2018-06-23 DIAGNOSIS — N183 Chronic kidney disease, stage 3 (moderate): Secondary | ICD-10-CM | POA: Diagnosis not present

## 2018-06-23 DIAGNOSIS — R7301 Impaired fasting glucose: Secondary | ICD-10-CM | POA: Diagnosis not present

## 2018-06-23 DIAGNOSIS — I10 Essential (primary) hypertension: Secondary | ICD-10-CM | POA: Diagnosis not present

## 2018-06-23 DIAGNOSIS — R208 Other disturbances of skin sensation: Secondary | ICD-10-CM | POA: Diagnosis not present

## 2018-06-24 LAB — CBC WITH DIFFERENTIAL/PLATELET
BASOS ABS: 31 {cells}/uL (ref 0–200)
Basophils Relative: 0.5 %
EOS ABS: 79 {cells}/uL (ref 15–500)
Eosinophils Relative: 1.3 %
HEMATOCRIT: 43.3 % (ref 35.0–45.0)
HEMOGLOBIN: 14.6 g/dL (ref 11.7–15.5)
LYMPHS ABS: 2586 {cells}/uL (ref 850–3900)
MCH: 29.3 pg (ref 27.0–33.0)
MCHC: 33.7 g/dL (ref 32.0–36.0)
MCV: 86.9 fL (ref 80.0–100.0)
MPV: 11.2 fL (ref 7.5–12.5)
Monocytes Relative: 7.9 %
NEUTROS ABS: 2922 {cells}/uL (ref 1500–7800)
Neutrophils Relative %: 47.9 %
Platelets: 204 10*3/uL (ref 140–400)
RBC: 4.98 10*6/uL (ref 3.80–5.10)
RDW: 12.7 % (ref 11.0–15.0)
Total Lymphocyte: 42.4 %
WBC: 6.1 10*3/uL (ref 3.8–10.8)
WBCMIX: 482 {cells}/uL (ref 200–950)

## 2018-06-24 LAB — COMPLETE METABOLIC PANEL WITH GFR
AG RATIO: 1.6 (calc) (ref 1.0–2.5)
ALBUMIN MSPROF: 4.2 g/dL (ref 3.6–5.1)
ALT: 20 U/L (ref 6–29)
AST: 24 U/L (ref 10–35)
Alkaline phosphatase (APISO): 85 U/L (ref 33–130)
BUN / CREAT RATIO: 10 (calc) (ref 6–22)
BUN: 11 mg/dL (ref 7–25)
CO2: 28 mmol/L (ref 20–32)
Calcium: 10 mg/dL (ref 8.6–10.4)
Chloride: 107 mmol/L (ref 98–110)
Creat: 1.1 mg/dL — ABNORMAL HIGH (ref 0.60–0.93)
GFR, EST AFRICAN AMERICAN: 57 mL/min/{1.73_m2} — AB (ref >=60)
GFR, EST NON AFRICAN AMERICAN: 49 mL/min/{1.73_m2} — AB (ref >=60)
GLOBULIN: 2.7 g/dL (ref 1.9–3.7)
Glucose, Bld: 91 mg/dL (ref 65–99)
Potassium: 3.7 mmol/L (ref 3.5–5.3)
SODIUM: 141 mmol/L (ref 135–146)
Total Bilirubin: 0.8 mg/dL (ref 0.2–1.2)
Total Protein: 6.9 g/dL (ref 6.1–8.1)

## 2018-06-24 LAB — VITAMIN B12: Vitamin B-12: 695 pg/mL (ref 200–1100)

## 2018-06-24 LAB — SEDIMENTATION RATE: Sed Rate: 6 mm/h (ref 0–30)

## 2018-06-24 LAB — TSH: TSH: 1.75 mIU/L (ref 0.40–4.50)

## 2018-06-25 ENCOUNTER — Other Ambulatory Visit: Payer: Self-pay | Admitting: *Deleted

## 2018-06-25 DIAGNOSIS — R7989 Other specified abnormal findings of blood chemistry: Secondary | ICD-10-CM

## 2018-07-06 ENCOUNTER — Ambulatory Visit (INDEPENDENT_AMBULATORY_CARE_PROVIDER_SITE_OTHER): Payer: Medicare HMO

## 2018-07-06 ENCOUNTER — Encounter: Payer: Self-pay | Admitting: Neurology

## 2018-07-06 ENCOUNTER — Ambulatory Visit (INDEPENDENT_AMBULATORY_CARE_PROVIDER_SITE_OTHER): Payer: Medicare HMO | Admitting: Family Medicine

## 2018-07-06 ENCOUNTER — Encounter: Payer: Self-pay | Admitting: Family Medicine

## 2018-07-06 VITALS — BP 121/58 | HR 84 | Ht 61.0 in | Wt 141.0 lb

## 2018-07-06 DIAGNOSIS — M810 Age-related osteoporosis without current pathological fracture: Secondary | ICD-10-CM | POA: Diagnosis not present

## 2018-07-06 DIAGNOSIS — S99912A Unspecified injury of left ankle, initial encounter: Secondary | ICD-10-CM | POA: Diagnosis not present

## 2018-07-06 DIAGNOSIS — M25572 Pain in left ankle and joints of left foot: Secondary | ICD-10-CM

## 2018-07-06 DIAGNOSIS — R208 Other disturbances of skin sensation: Secondary | ICD-10-CM

## 2018-07-06 MED ORDER — ACTONEL 30 MG PO TABS: 30.0000 mg | ORAL_TABLET | ORAL | 3 refills | Status: DC

## 2018-07-06 NOTE — Progress Notes (Signed)
Subjective:    CC:   HPI:  77 year old female is here today for 2-week follow-up.  When she came in for her routine check for blood pressure and glucose which was normal she had also mention problems with feeling like her right arm was bigger as well as a burning sensation in her right arm.  She also was noting that it time she was having a very slow pulse.  That her pulse was in the normal range that particular day.  On her labs her renal function was up just slightly from baseline at 1.1.  I put a note to recheck it in about 3 months.  Liver enzymes were normal.  CBC was normal.  No anemia.  Thyroid was normal.  Inflammatory markers were negative and her B12 looks great.  He says her right arm feels better when she is more active and uses it regularly.  She also wants me to look at her left ankle today.  The day after I saw her she actually says she tripped the ankle went underneath her and inverted.  She is had pain mostly on the outside of the ankle ever since anterior to the medial malleolus.  Its been a little bit swollen she did ice it and she says it is getting better.  She is able to walk on it has not had difficulty with that.  Osteoporosis-she would like to restart her risedronate.  She is actually been off of it for about a year because of the time she was convinced it was probably causing some back pain issues.  She does feel like her back is better than it used to be and she is willing to retry it but would like to consider retrying the brand.  She says she always felt great on the brand Actonel.  Past medical history, Surgical history, Family history not pertinant except as noted below, Social history, Allergies, and medications have been entered into the medical record, reviewed, and corrections made.   Review of Systems: No fevers, chills, night sweats, weight loss, chest pain, or shortness of breath.   Objective:    General: Well Developed, well nourished, and in no acute  distress.  Neuro: Alert and oriented x3, extra-ocular muscles intact, sensation grossly intact.  HEENT: Normocephalic, atraumatic  Skin: Warm and dry, no rashes. Cardiac: Regular rate and rhythm, no murmurs rubs or gallops, no lower extremity edema.  Respiratory: Clear to auscultation bilaterally. Not using accessory muscles, speaking in full sentences. MSK: Left ankle with trace edema laterally and on the top of the foot.  Normal range of motion.  No increased laxity with anterior drawer.  Strength is 5 out of 5 in all directions.  Mildly tender at the base of the lateral malleolus.  And some tenderness over the fifth and fourth proximal metatarsal head.  Nontender of the medial malleolus.   Impression and Recommendations:   Right arm pain and burning over the middle of the arm.  Gust options including a nerve conduction study.  She would like to consult with neurology first which I think is absolutely reasonable.  Explained that this could be a nerve issue that could be coming from her shoulder or even her elbow or neck.  Left ankle pain -she just has a sprain.  Fortunately she has been able to walk on it she does have some trace edema laterally and on the top of the foot though some of that is chronic.  She can use an Ace wrap  for support.  Will call with x-ray results once available.  Osteoporosis-we will send over prescription for brand Actonel. acutally printed script so she could find the cheapest price. It is worsens her back pain she will let me know.

## 2018-07-06 NOTE — Patient Instructions (Signed)
And to recheck your kidney function in about 2 months.  You can just call around that time and we can order a lab slip for you to go to the lab.

## 2018-07-22 ENCOUNTER — Other Ambulatory Visit: Payer: Self-pay | Admitting: Family Medicine

## 2018-07-22 DIAGNOSIS — M810 Age-related osteoporosis without current pathological fracture: Secondary | ICD-10-CM

## 2018-07-22 MED ORDER — RISEDRONATE SODIUM 35 MG PO TABS: 35.0000 mg | ORAL_TABLET | ORAL | 3 refills | Status: DC

## 2018-07-24 ENCOUNTER — Encounter: Payer: Self-pay | Admitting: Family Medicine

## 2018-07-24 DIAGNOSIS — N183 Chronic kidney disease, stage 3 unspecified: Secondary | ICD-10-CM

## 2018-07-24 DIAGNOSIS — G8929 Other chronic pain: Secondary | ICD-10-CM

## 2018-07-24 DIAGNOSIS — M545 Low back pain: Secondary | ICD-10-CM

## 2018-07-24 DIAGNOSIS — R300 Dysuria: Secondary | ICD-10-CM

## 2018-07-24 DIAGNOSIS — R208 Other disturbances of skin sensation: Secondary | ICD-10-CM

## 2018-07-27 NOTE — Telephone Encounter (Signed)
Labs ordered. Pt advised. She will complete tomorrow AM

## 2018-07-28 DIAGNOSIS — M545 Low back pain: Secondary | ICD-10-CM | POA: Diagnosis not present

## 2018-07-28 DIAGNOSIS — N183 Chronic kidney disease, stage 3 (moderate): Secondary | ICD-10-CM | POA: Diagnosis not present

## 2018-07-28 DIAGNOSIS — G8929 Other chronic pain: Secondary | ICD-10-CM | POA: Diagnosis not present

## 2018-07-28 DIAGNOSIS — R300 Dysuria: Secondary | ICD-10-CM | POA: Diagnosis not present

## 2018-07-28 DIAGNOSIS — R208 Other disturbances of skin sensation: Secondary | ICD-10-CM | POA: Diagnosis not present

## 2018-07-29 LAB — URINE CULTURE
MICRO NUMBER: 91210383
SPECIMEN QUALITY: ADEQUATE

## 2018-07-29 LAB — BASIC METABOLIC PANEL
BUN / CREAT RATIO: 18 (calc) (ref 6–22)
BUN: 19 mg/dL (ref 7–25)
CHLORIDE: 106 mmol/L (ref 98–110)
CO2: 26 mmol/L (ref 20–32)
CREATININE: 1.08 mg/dL — AB (ref 0.60–0.93)
Calcium: 9.7 mg/dL (ref 8.6–10.4)
Glucose, Bld: 96 mg/dL (ref 65–99)
POTASSIUM: 3.8 mmol/L (ref 3.5–5.3)
Sodium: 141 mmol/L (ref 135–146)

## 2018-07-29 LAB — URINALYSIS, ROUTINE W REFLEX MICROSCOPIC
BILIRUBIN URINE: NEGATIVE
GLUCOSE, UA: NEGATIVE
Hgb urine dipstick: NEGATIVE
LEUKOCYTES UA: NEGATIVE
Nitrite: NEGATIVE
Protein, ur: NEGATIVE
SPECIFIC GRAVITY, URINE: 1.021 (ref 1.001–1.03)

## 2018-09-19 ENCOUNTER — Emergency Department (HOSPITAL_BASED_OUTPATIENT_CLINIC_OR_DEPARTMENT_OTHER)
Admission: EM | Admit: 2018-09-19 | Discharge: 2018-09-19 | Disposition: A | Discharge status: Home / Self Care | Payer: Medicare HMO | Attending: Emergency Medicine | Admitting: Emergency Medicine

## 2018-09-19 ENCOUNTER — Encounter (HOSPITAL_BASED_OUTPATIENT_CLINIC_OR_DEPARTMENT_OTHER): Payer: Self-pay | Admitting: Emergency Medicine

## 2018-09-19 ENCOUNTER — Other Ambulatory Visit: Payer: Self-pay

## 2018-09-19 ENCOUNTER — Emergency Department (HOSPITAL_BASED_OUTPATIENT_CLINIC_OR_DEPARTMENT_OTHER): Payer: Medicare HMO

## 2018-09-19 DIAGNOSIS — K449 Diaphragmatic hernia without obstruction or gangrene: Secondary | ICD-10-CM | POA: Diagnosis not present

## 2018-09-19 DIAGNOSIS — E876 Hypokalemia: Secondary | ICD-10-CM | POA: Diagnosis not present

## 2018-09-19 DIAGNOSIS — Z79899 Other long term (current) drug therapy: Secondary | ICD-10-CM | POA: Diagnosis not present

## 2018-09-19 DIAGNOSIS — K529 Noninfective gastroenteritis and colitis, unspecified: Secondary | ICD-10-CM | POA: Diagnosis not present

## 2018-09-19 DIAGNOSIS — N183 Chronic kidney disease, stage 3 (moderate): Secondary | ICD-10-CM | POA: Insufficient documentation

## 2018-09-19 DIAGNOSIS — R1032 Left lower quadrant pain: Secondary | ICD-10-CM | POA: Diagnosis present

## 2018-09-19 DIAGNOSIS — I129 Hypertensive chronic kidney disease with stage 1 through stage 4 chronic kidney disease, or unspecified chronic kidney disease: Secondary | ICD-10-CM | POA: Diagnosis not present

## 2018-09-19 LAB — COMPREHENSIVE METABOLIC PANEL
ALT: 23 U/L (ref 0–44)
AST: 30 U/L (ref 15–41)
Albumin: 4.1 g/dL (ref 3.5–5.0)
Alkaline Phosphatase: 73 U/L (ref 38–126)
Anion gap: 9 (ref 5–15)
BILIRUBIN TOTAL: 1 mg/dL (ref 0.3–1.2)
BUN: 10 mg/dL (ref 8–23)
CO2: 22 mmol/L (ref 22–32)
Calcium: 9.1 mg/dL (ref 8.9–10.3)
Chloride: 107 mmol/L (ref 98–111)
Creatinine, Ser: 0.88 mg/dL (ref 0.44–1.00)
GFR calc Af Amer: >60 mL/min (ref >60)
GFR calc non Af Amer: >60 mL/min (ref >60)
Glucose, Bld: 117 mg/dL — ABNORMAL HIGH (ref 70–99)
POTASSIUM: 3.1 mmol/L — AB (ref 3.5–5.1)
Sodium: 138 mmol/L (ref 135–145)
TOTAL PROTEIN: 7.1 g/dL (ref 6.5–8.1)

## 2018-09-19 LAB — CBC WITH DIFFERENTIAL/PLATELET
Abs Immature Granulocytes: 0.02 10*3/uL (ref 0.00–0.07)
Basophils Absolute: 0 10*3/uL (ref 0.0–0.1)
Basophils Relative: 0 %
EOS ABS: 0 10*3/uL (ref 0.0–0.5)
Eosinophils Relative: 0 %
HEMATOCRIT: 43.5 % (ref 36.0–46.0)
Hemoglobin: 13.7 g/dL (ref 12.0–15.0)
Immature Granulocytes: 0 %
LYMPHS ABS: 1.6 10*3/uL (ref 0.7–4.0)
Lymphocytes Relative: 17 %
MCH: 28.7 pg (ref 26.0–34.0)
MCHC: 31.5 g/dL (ref 30.0–36.0)
MCV: 91 fL (ref 80.0–100.0)
MONOS PCT: 8 %
Monocytes Absolute: 0.7 10*3/uL (ref 0.1–1.0)
NRBC: 0 % (ref 0.0–0.2)
Neutro Abs: 6.9 10*3/uL (ref 1.7–7.7)
Neutrophils Relative %: 75 %
Platelets: 187 10*3/uL (ref 150–400)
RBC: 4.78 MIL/uL (ref 3.87–5.11)
RDW: 13.5 % (ref 11.5–15.5)
WBC: 9.2 10*3/uL (ref 4.0–10.5)

## 2018-09-19 MED ORDER — METRONIDAZOLE 500 MG PO TABS
500.0000 mg | ORAL_TABLET | Freq: Once | ORAL | Status: AC
  Administered 2018-09-19: 500 mg via ORAL
  Filled 2018-09-19: qty 1

## 2018-09-19 MED ORDER — CIPROFLOXACIN HCL 500 MG PO TABS
500.0000 mg | ORAL_TABLET | Freq: Once | ORAL | Status: AC
  Administered 2018-09-19: 500 mg via ORAL
  Filled 2018-09-19: qty 1

## 2018-09-19 MED ORDER — CIPROFLOXACIN HCL 500 MG PO TABS: 500.0000 mg | ORAL_TABLET | Freq: Two times a day (BID) | ORAL | 0 refills | Status: DC

## 2018-09-19 MED ORDER — IOPAMIDOL (ISOVUE-300) INJECTION 61%
100.0000 mL | Freq: Once | INTRAVENOUS | Status: AC | PRN
  Administered 2018-09-19: 100 mL via INTRAVENOUS

## 2018-09-19 MED ORDER — POTASSIUM CHLORIDE CRYS ER 20 MEQ PO TBCR
40.0000 meq | EXTENDED_RELEASE_TABLET | Freq: Once | ORAL | Status: AC
  Administered 2018-09-19: 40 meq via ORAL
  Filled 2018-09-19: qty 2

## 2018-09-19 MED ORDER — METRONIDAZOLE 500 MG PO TABS: 500.0000 mg | ORAL_TABLET | Freq: Two times a day (BID) | ORAL | 0 refills | Status: DC

## 2018-09-19 NOTE — ED Provider Notes (Signed)
[facility]PART[facility] Provider Note   CSN: [id number] Arrival date & time: 09/19/18  1036     History   Chief Complaint Chief Complaint  Patient presents with  . Abdominal Pain  . Rectal Bleeding    HPI Linda Werner is a 77 y.o. female.  HPI complains of abdominal pain at left lower quadrant, nonradiating onset last night.  Pain is mild.  Nothing makes symptoms better or worse.  No other associated symptoms.  Associated symptoms include rectal bleeding with red blood around stool.  No fever.  She vomited one time last night.  No nausea at present.  No other associated symptoms.  No treatment prior to coming here.  She is presently pain-free.  Pain resolved upon arrival here.  No lightheadedness no other associated symptoms.  No urinary symptoms  Past Medical History:  Diagnosis Date  . Allergy   . Anxiety   . Arthritis not diagnosed  . Cataract   . CKD (chronic kidney disease) stage 3, GFR 30-59 ml/min (HCC)   . Heart murmur last visit  . Hx of adenomatous polyp of colon 12/04/2016  . Hypercholesteremia   . Hypertension   . Neuromuscular disorder ([place]) not diagnosed  . Osteoporosis    Diverticulosis Patient Active Problem List   Diagnosis Date Noted  . Hx of adenomatous polyp of colon 12/04/2016  . Systolic murmur 41/96/2229  . Cataract, senile 09/02/2012  . CKD (chronic kidney disease) stage 3, GFR 30-59 ml/min (HCC) 03/22/2009  . VITILIGO 03/22/2009  . IMPAIRED FASTING GLUCOSE 03/22/2009  . Hyperlipemia 09/01/2008  . ESSENTIAL HYPERTENSION, BENIGN 09/01/2008  . Osteoporosis 09/01/2008    Past Surgical History:  Procedure Laterality Date  . ABDOMINAL HYSTERECTOMY    . BREAST BIOPSY  2011   left  . COLONOSCOPY       OB History   None      Home Medications    Prior to Admission medications   Medication Sig Start Date End Date Taking? Authorizing Provider  amLODipine (NORVASC) 10 MG tablet Take 1 tablet (10 mg total) by  mouth daily. 03/12/18   Hali Marry, MD  betamethasone dipropionate (DIPROLENE) 0.05 % cream Apply topically 2 (two) times daily. To affected area(s) as needed for up to two weeks 10/09/17   Emeterio Reeve, DO  calcium carbonate (OS-CAL) 600 MG TABS Take 600 mg by mouth 2 (two) times daily with a meal.    [provider]  losartan (COZAAR) 100 MG tablet Take 1 tablet (100 mg total) by mouth daily. 03/12/18   Hali Marry, MD  Multiple Vitamins-Minerals (CENTRUM SILVER PO) Take 1 tablet by mouth once.    [provider]  risedronate (ACTONEL) 35 MG tablet Take 1 tablet (35 mg total) by mouth every 7 (seven) days. with water on empty stomach, nothing by mouth or lie down for next 30 minutes. 07/22/18   Hali Marry, MD  Vitamin D, Cholecalciferol, 400 UNITS CHEW Chew by mouth.    [provider]    Family History Family History  Problem Relation Age of Onset  . Hypertension Unknown   . Arthritis Sister   . Cancer Sister   . Early death Daughter   . Colon cancer Neg Hx     Social History Social History   Tobacco Use  . Smoking status: Never Smoker  . Smokeless tobacco: Never Used  Substance Use Topics  . Alcohol use: No  . Drug use: No  Allergies   Alendronate sodium; Atorvastatin; Benazepril; Statins; and Verapamil hcl   Review of Systems Review of Systems  Constitutional: Negative.   HENT: Negative.   Respiratory: Negative.   Cardiovascular: Negative.   Gastrointestinal: Positive for abdominal pain and blood in stool.  Musculoskeletal: Negative.   Skin: Negative.   Neurological: Negative.   Psychiatric/Behavioral: Negative.   All other systems reviewed and are negative.    Physical Exam Updated Vital Signs BP 134/74 (BP Location: Left Arm)   Pulse 88   Temp 98.2 F (36.8 C) (Oral)   Resp 16   Ht 5\' 1"  (1.549 m)   Wt 64 kg   SpO2 98%   BMI 26.64 kg/m   Physical Exam  Constitutional: She appears  well-developed and well-nourished.  HENT:  Head: Normocephalic and atraumatic.  Eyes: Pupils are equal, round, and reactive to light. Conjunctivae are normal.  Neck: Neck supple. No tracheal deviation present. No thyromegaly present.  Cardiovascular: Normal rate and regular rhythm.  No murmur heard. Pulmonary/Chest: Effort normal and breath sounds normal.  Abdominal: Soft. Bowel sounds are normal. She exhibits no distension. There is no tenderness.  Genitourinary:  Genitourinary Comments: Rectum normal tone nontender.  Slight amount of gross red blood on examining finger  Musculoskeletal: Normal range of motion. She exhibits no edema or tenderness.  Neurological: She is alert. Coordination normal.  Skin: Skin is warm and dry. No rash noted.  Psychiatric: She has a normal mood and affect.  Nursing note and vitals reviewed.    ED Treatments / Results  Labs (all labs ordered are listed, but only abnormal results are displayed) Labs Reviewed  CBC WITH DIFFERENTIAL/PLATELET  COMPREHENSIVE METABOLIC PANEL    EKG None  Radiology No results found.  Procedures Procedures (including critical care time)  Medications Ordered in ED Medications - No data to display  1:10 PM patient resting comfortably.  Asymptomatic Results for orders placed or performed during the hospital encounter of 09/19/18  CBC with Differential/Platelet  Result Value Ref Range   WBC 9.2 4.0 - 10.5 K/uL   RBC 4.78 3.87 - 5.11 MIL/uL   Hemoglobin 13.7 12.0 - 15.0 g/dL   HCT 43.5 36.0 - 46.0 %   MCV 91.0 80.0 - 100.0 fL   MCH 28.7 26.0 - 34.0 pg   MCHC 31.5 30.0 - 36.0 g/dL   RDW 13.5 11.5 - 15.5 %   Platelets 187 150 - 400 K/uL   nRBC 0.0 0.0 - 0.2 %   Neutrophils Relative % 75 %   Neutro Abs 6.9 1.7 - 7.7 K/uL   Lymphocytes Relative 17 %   Lymphs Abs 1.6 0.7 - 4.0 K/uL   Monocytes Relative 8 %   Monocytes Absolute 0.7 0.1 - 1.0 K/uL   Eosinophils Relative 0 %   Eosinophils Absolute 0.0 0.0 - 0.5  K/uL   Basophils Relative 0 %   Basophils Absolute 0.0 0.0 - 0.1 K/uL   Immature Granulocytes 0 %   Abs Immature Granulocytes 0.02 0.00 - 0.07 K/uL  Comprehensive metabolic panel  Result Value Ref Range   Sodium 138 135 - 145 mmol/L   Potassium 3.1 (L) 3.5 - 5.1 mmol/L   Chloride 107 98 - 111 mmol/L   CO2 22 22 - 32 mmol/L   Glucose, Bld 117 (H) 70 - 99 mg/dL   BUN 10 8 - 23 mg/dL   Creatinine, Ser 0.88 0.44 - 1.00 mg/dL   Calcium 9.1 8.9 - 10.3 mg/dL   Total Protein  7.1 6.5 - 8.1 g/dL   Albumin 4.1 3.5 - 5.0 g/dL   AST 30 15 - 41 U/L   ALT 23 0 - 44 U/L   Alkaline Phosphatase 73 38 - 126 U/L   Total Bilirubin 1.0 0.3 - 1.2 mg/dL   GFR calc non Af Amer >60 >60 mL/min   GFR calc Af Amer >60 >60 mL/min   Anion gap 9 5 - 15   Ct Abdomen Pelvis W Contrast  Result Date: 09/19/2018 CLINICAL DATA:  Left lower quadrant abdominal pain and rectal bleeding for 1 day. Prior hysterectomy. EXAM: CT ABDOMEN AND PELVIS WITH CONTRAST TECHNIQUE: Multidetector CT imaging of the abdomen and pelvis was performed using the standard protocol following bolus administration of intravenous contrast. CONTRAST:  131mL ISOVUE-300 IOPAMIDOL (ISOVUE-300) INJECTION 61% COMPARISON:  None. FINDINGS: Lower chest: No significant pulmonary nodules or acute consolidative airspace disease. Coronary atherosclerosis. Mild eventration of the right hemidiaphragm. Hepatobiliary: Normal liver size. Subcentimeter peripheral right liver lobe loop hypodense lesion is too small to characterize and requires no follow-up unless the patient has risk factors for liver malignancy. No additional liver lesions. Normal gallbladder with no radiopaque cholelithiasis. No biliary ductal dilatation. Pancreas: Normal, with no mass or duct dilation. Spleen: Normal size. No mass. Adrenals/Urinary Tract: Normal adrenals. No hydronephrosis. Simple exophytic 6.4 cm lateral upper left renal cyst. Subcentimeter hypodense upper right renal cortical lesion is  too small to characterize and requires no follow-up. Normal bladder. Stomach/Bowel: Small hiatal hernia. Otherwise normal nondistended stomach. Normal caliber small bowel with no small bowel wall thickening. Normal diminutive appendix. There is mild continuous wall thickening with slight pericolonic fat haziness involving the splenic flexure of the colon, descending colon, sigmoid colon and rectum. No significant colonic diverticulosis. Vascular/Lymphatic: Atherosclerotic nonaneurysmal abdominal aorta. Patent portal, splenic, hepatic and renal veins. No pathologically enlarged lymph nodes in the abdomen or pelvis. Reproductive: Status post hysterectomy, with no abnormal findings at the vaginal cuff. No adnexal mass. Other: No pneumoperitoneum, ascites or focal fluid collection. Musculoskeletal: No aggressive appearing focal osseous lesions. Moderate lower lumbar spondylosis. IMPRESSION: 1. Mild wall thickening throughout the left colon and rectum with slight pericolonic fat haziness, compatible with a nonspecific infectious or inflammatory proctocolitis. Consider C diff colitis. 2.  Aortic Atherosclerosis (ICD10-I70.0). 3. Coronary atherosclerosis. 4. Small hiatal hernia. Electronically Signed   By: Ilona Sorrel M.D.   On: 09/19/2018 12:42   Initial Impression / Assessment and Plan / ED Course  I have reviewed the triage vital signs and the nursing notes.  Pertinent labs & imaging results that were available during my care of the patient were reviewed by me and considered in my medical decision making (see chart for details).     Lab work consistent with mild hypokalemia otherwise normal.  CT scan reviewed.  Doubt C. difficile colitis.  Patient has not had copious diarrhea.  Plan she will receive potassium chloride 40 mEq p.o. prior to discharge as well as Cipro 500 mg p.o., Flagyl 500 mg p.o. prior to discharge.  Prescription Cipro Flagyl.  Follow-up with PMD  Final Clinical Impressions(s) / ED Diagnoses   Diagnosis #1 acute proctocolitis #2 hypokalemia Final diagnoses:  None    ED Discharge Orders    None       Orlie Dakin, MD 09/19/18 [phone]

## 2018-09-19 NOTE — ED Triage Notes (Signed)
Pt with LLQ pain and bright red rectal bleeding since yesterday. States she has a hx of diverticulitis.

## 2018-09-19 NOTE — Discharge Instructions (Signed)
Call Dr. Madilyn Fireman in 2 days to schedule a follow-up visit within the next 7 to 10 days.  You may need to be referred back to your gastroenterologist.  Return to the emergency department for lightheadedness, worsening pain, fever, vomiting or if your condition worsens for any reason

## 2018-10-05 ENCOUNTER — Ambulatory Visit: Payer: Medicare HMO | Admitting: Neurology

## 2018-10-12 ENCOUNTER — Other Ambulatory Visit: Payer: Self-pay | Admitting: Family Medicine

## 2018-11-25 ENCOUNTER — Encounter: Payer: Self-pay | Admitting: Osteopathic Medicine

## 2018-11-25 ENCOUNTER — Ambulatory Visit (INDEPENDENT_AMBULATORY_CARE_PROVIDER_SITE_OTHER): Payer: Medicare HMO | Admitting: Osteopathic Medicine

## 2018-11-25 VITALS — BP 112/61 | HR 98 | Temp 98.0°F | Wt 133.6 lb

## 2018-11-25 DIAGNOSIS — R194 Change in bowel habit: Secondary | ICD-10-CM | POA: Diagnosis not present

## 2018-11-25 NOTE — Patient Instructions (Signed)
CONSTIPATION OVERVIEW - Constipation refers to a change in bowel habits, but it has varied meanings. Stools may be too hard or too small, difficult to pass, or infrequent (less than three times per week). People with constipation may also notice a frequent need to strain and a sense that the bowels are not empty.  Constipation is a very common problem. Each year more than 2.5 million Americans visit their healthcare provider for relief from this problem. Many factors can contribute to or cause constipation, although in most people, no single cause can be found. In general, constipation occurs more frequently as you get older.   CONSTIPATION DIAGNOSIS - Constipation can usually be diagnosed based upon your symptoms and a physical examination. You should also mention any medications you take regularly since some medications can cause constipation.  When to seek help - Most people can treat constipation at home, without seeing a healthcare provider. However, you should speak with a healthcare provider if the problem: ?Is new (ie, represents a change in your normal pattern) ?Lasts longer than three weeks ?Is severe ?Is associated with any other concerning features such as blood on the toilet paper, weight loss, fevers, or weakness   CONSTIPATION TREATMENT - Treatment for constipation includes changing some behaviors, eating foods high in fiber, and using laxatives or enemas if needed.  You can try these treatments at home, before seeing a healthcare provider. However, if you do not have a bowel movement within a few days, you should call your healthcare provider for further assistance.   Behavior changes - The bowels are most active following meals, and this is often the time when stools will pass most readily. If you ignore your body's signals to have a bowel movement, the signals become weaker and weaker over time. By paying close attention to these signals, you may have an easier time moving your  bowels. Drinking a caffeine-containing beverage in the morning may also be helpful.  Increase fiber - Increasing fiber in your diet may reduce or eliminate constipation. The recommended amount of dietary fiber is 20 to 35 grams of fiber per day. By reading the product information panel on the side of the package, you can determine the number of grams of fiber per serving Many fruits and vegetables can be particularly helpful in preventing and treating constipation. This is especially true of citrus fruits, prunes, and prune juice. Some breakfast cereals are also an excellent source of dietary fiber.  Fiber side effects - Consuming large amounts of fiber can cause abdominal bloating or gas; this can be minimized by starting with a small amount and slowly increasing until stools become softer and more frequent.   LAXATIVES - If behavior changes and increasing fiber does not relieve your constipation, you may try taking a laxative. A variety of laxatives are available for treating constipation. The choice between them is based upon how they work, how safe the treatment is, and your healthcare provider's preferences. In general, laxatives can be categorized into the following groups:  Bulk forming laxatives - These include natural fiber and commercial fiber preparations such as: ?Psyllium (Konsyl; Metamucil; Perdiem) ?Methylcellulose (Citrucel) ?Calcium polycarbophil (FiberCon; Fiber-Lax; Mitrolan) ?Wheat dextrin (Benefiber) You should increase the dose of fiber supplements slowly to prevent gas and cramping, and you should always take the supplement with plenty of fluid.  Hyperosmolar laxatives - Hyperosmolar laxatives include: ?Polyethylene glycol (MiraLax, Glycolax) ?Lactulose ?Sorbitol Polyethylene glycol is generally preferred since it does not cause gas or bloating and is available  in the [place] without a prescription. Lactulose and sorbitol can produce gas and bloating. Sorbitol works  as well as lactulose and is much less expensive.  Saline laxatives - Saline laxatives such as magnesium hydroxide (Milk of Magnesia) and magnesium citrate (Evac-Q-Mag) act similarly to the hyperosmolar laxatives.  Stimulant laxatives - Stimulant laxatives include senna (eg, Black Draught, Ex-lax, Fletcher's, Castoria, Senokot) and bisacodyl (eg, Correctol, Doxidan, Dulcolax). Some people overuse stimulant laxatives. Taking stimulant laxatives regularly or in large amounts can cause side effects, including low potassium levels. Thus, you should take these drugs carefully if you must use them regularly. However, there is no convincing evidence that using stimulant laxatives regularly damages the colon, and they do not increase the risk for colorectal cancer or other tumors.  New treatments - Lubiprostone (Amitiza) is a prescription medication that treats severe constipation. It is expensive, but it may be recommended if you do not respond to other treatments. Linaclotide (Linzess) is another prescription medication that has been approved for the treatment of chronic constipation. It is also an expensive medication as compared with other agents with the exception of lubiprostone. Linaclotide may be recommended if you do not respond to other treatments.  Constipation treatments to avoid ?Emollients - Emollient laxatives, principally mineral oil, soften stools by moisturizing them. However, other treatments have fewer risks and equal benefit. ?Natural products - A wide variety of natural products are advertised for constipation. Some of them contain the active ingredients found in commercially available laxatives. However, their dose and purity may not be carefully controlled. Thus, these products are not generally recommended. ?A variety of home-made enema preparations have been used throughout the years, such as soapsuds, hydrogen peroxide, and household detergents. These can be extremely irritating to the  lining of the intestine and should be avoided.

## 2018-11-25 NOTE — Progress Notes (Signed)
HPI: Linda Werner is a 78 y.o. female who  has a past medical history of Allergy, Anxiety, Arthritis (not diagnosed), Cataract, CKD (chronic kidney disease) stage 3, GFR 30-59 ml/min ([place]), Heart murmur (last visit), adenomatous polyp of colon (12/04/2016), Hypercholesteremia, Hypertension, Neuromuscular disorder ([place]) (not diagnosed), and Osteoporosis.  she presents to [facility] today, 11/25/18,  for chief complaint of: Decreased BM frequency - Concern for colon cancer / diverticulitis   . Quality: 1-2 BM/week, no pain or difficulty with passage of stool . Context: since cutting out red meat and stopping eating as much oatmeal seems to have changed BM. Hx diverticulitis and she's worried about this.  . Duration: little over a month . Modifying factors: no OTC meds, increased fiber.  . Assoc signs/symptoms: no bloody/black stool, no abdominal pain   Colonoscopy results reviewed drom 11/2016 - small polyp, recommended no further scopes for screening d/t age       Past medical, surgical, social and family history reviewed:  Patient Active Problem List   Diagnosis Date Noted  . Hx of adenomatous polyp of colon 12/04/2016  . Systolic murmur 84/[id number]/6295  . Cataract, senile 09/02/2012  . CKD (chronic kidney disease) stage 3, GFR 30-59 ml/min ([facility]) 03/22/2009  . VITILIGO 03/22/2009  . IMPAIRED FASTING GLUCOSE 03/22/2009  . Hyperlipemia 09/01/2008  . ESSENTIAL HYPERTENSION, BENIGN 09/01/2008  . Osteoporosis 09/01/2008    Past Surgical History:  Procedure Laterality Date  . ABDOMINAL HYSTERECTOMY    . BREAST BIOPSY  2011   left  . COLONOSCOPY      Social History   Tobacco Use  . Smoking status: Never Smoker  . Smokeless tobacco: Never Used  Substance Use Topics  . Alcohol use: No    Family History  Problem Relation Age of Onset  . Hypertension Unknown   . Arthritis Sister   . Cancer Sister   . Early death Daughter   . Colon  cancer Neg Hx      Current medication list and allergy/intolerance information reviewed:    Current Outpatient Medications  Medication Sig Dispense Refill  . amLODipine (NORVASC) 10 MG tablet TAKE 1 TABLET EVERY DAY 90 tablet 0  . calcium carbonate (OS-CAL) 600 MG TABS Take 600 mg by mouth 2 (two) times daily with a meal.    . Multiple Vitamins-Minerals (CENTRUM SILVER PO) Take 1 tablet by mouth once.    . risedronate (ACTONEL) 35 MG tablet Take 1 tablet (35 mg total) by mouth every 7 (seven) days. with water on empty stomach, nothing by mouth or lie down for next 30 minutes. 12 tablet 3  . Vitamin D, Cholecalciferol, 400 UNITS CHEW Chew by mouth.    . betamethasone dipropionate (DIPROLENE) 0.05 % cream Apply topically 2 (two) times daily. To affected area(s) as needed for up to two weeks (Patient not taking: Reported on 11/25/2018) 45 g 0  . ciprofloxacin (CIPRO) 500 MG tablet Take 1 tablet (500 mg total) by mouth 2 (two) times daily. One po bid x 7 days (Patient not taking: Reported on 11/25/2018) 14 tablet 0  . losartan (COZAAR) 100 MG tablet Take 1 tablet (100 mg total) by mouth daily. (Patient not taking: Reported on 11/25/2018) 90 tablet 1  . metroNIDAZOLE (FLAGYL) 500 MG tablet Take 1 tablet (500 mg total) by mouth 2 (two) times daily. One po bid x 7 days (Patient not taking: Reported on 11/25/2018) 14 tablet 0   No current facility-administered medications for this visit.  Allergies  Allergen Reactions  . Alendronate Sodium     REACTION: rash  . Atorvastatin Other (See Comments)    Myalgias.    . Benazepril Cough  . Statins     REACTION: myalgias  . Verapamil Hcl     REACTION: swelling      Review of Systems:  Constitutional:  No  fever, no chills, No recent illness  Cardiac: No  chest pain, No  pressure  Respiratory:  No  shortness of breath. No  Cough  Gastrointestinal: No  abdominal pain, No  nausea, No  vomiting,  No  blood in stool, No  diarrhea, No painful  constipation   Musculoskeletal: No new myalgia/arthralgia  Skin: No  Rash  Endocrine: No cold intolerance,  No heat intolerance. No polyuria/polydipsia/polyphagia    Neurologic: No  weakness, No  dizziness  Psychiatric: No  concerns with depression, No  concerns with anxiety  Exam:  BP 112/61 (BP Location: Left Arm, Patient Position: Sitting, Cuff Size: Normal)   Pulse 98   Temp 98 F (36.7 C) (Oral)   Wt 133 lb 9.6 oz (60.6 kg)   BMI 25.24 kg/m   Constitutional: VS see above. General Appearance: alert, well-developed, well-nourished, NAD  Eyes: Normal lids and conjunctive, non-icteric sclera  Ears, Nose, Mouth, Throat: MMM, Normal external inspection ears/nares/mouth/lips/gums.   Neck: No masses, trachea midline.   Respiratory: Normal respiratory effort.   Gastrointestinal: Nontender, no masses. No hepatomegaly, no splenomegaly. No hernia appreciated. Bowel sounds normal. Rectal exam deferred.   Musculoskeletal: Gait normal.   Neurological: Normal balance/coordination. No tremor.  Skin: warm, dry, intact. No rash/ulcer.    Psychiatric: Normal judgment/insight. Normal mood and affect. Oriented x3.      ASSESSMENT/PLAN: The encounter diagnosis was Decreased frequency of bowel movements.   Advised this is not diverticulitis, sypmtoms do not align w/ malignancy and colonoscopy was reassuring <2 years ago  Discussed lifestyle modifications, OTC meds, diet changes and exercise which might help stool transit   Orders Placed This Encounter  Procedures  . TSH  . CBC  . COMPLETE METABOLIC PANEL WITH GFR     Patient Instructions  CONSTIPATION OVERVIEW - Constipation refers to a change in bowel habits, but it has varied meanings. Stools may be too hard or too small, difficult to pass, or infrequent (less than three times per week). People with constipation may also notice a frequent need to strain and a sense that the bowels are not empty.  Constipation is a very  common problem. Each year more than 2.5 million Americans visit their healthcare provider for relief from this problem. Many factors can contribute to or cause constipation, although in most people, no single cause can be found. In general, constipation occurs more frequently as you get older.   CONSTIPATION DIAGNOSIS - Constipation can usually be diagnosed based upon your symptoms and a physical examination. You should also mention any medications you take regularly since some medications can cause constipation.  When to seek help - Most people can treat constipation at home, without seeing a healthcare provider. However, you should speak with a healthcare provider if the problem: ?Is new (ie, represents a change in your normal pattern) ?Lasts longer than three weeks ?Is severe ?Is associated with any other concerning features such as blood on the toilet paper, weight loss, fevers, or weakness   CONSTIPATION TREATMENT - Treatment for constipation includes changing some behaviors, eating foods high in fiber, and using laxatives or enemas if needed.  You  can try these treatments at home, before seeing a healthcare provider. However, if you do not have a bowel movement within a few days, you should call your healthcare provider for further assistance.   Behavior changes - The bowels are most active following meals, and this is often the time when stools will pass most readily. If you ignore your body's signals to have a bowel movement, the signals become weaker and weaker over time. By paying close attention to these signals, you may have an easier time moving your bowels. Drinking a caffeine-containing beverage in the morning may also be helpful.  Increase fiber - Increasing fiber in your diet may reduce or eliminate constipation. The recommended amount of dietary fiber is 20 to 35 grams of fiber per day. By reading the product information panel on the side of the package, you can determine the  number of grams of fiber per serving Many fruits and vegetables can be particularly helpful in preventing and treating constipation. This is especially true of citrus fruits, prunes, and prune juice. Some breakfast cereals are also an excellent source of dietary fiber.  Fiber side effects - Consuming large amounts of fiber can cause abdominal bloating or gas; this can be minimized by starting with a small amount and slowly increasing until stools become softer and more frequent.   LAXATIVES - If behavior changes and increasing fiber does not relieve your constipation, you may try taking a laxative. A variety of laxatives are available for treating constipation. The choice between them is based upon how they work, how safe the treatment is, and your healthcare provider's preferences. In general, laxatives can be categorized into the following groups:  Bulk forming laxatives - These include natural fiber and commercial fiber preparations such as: ?Psyllium (Konsyl; Metamucil; Perdiem) ?Methylcellulose (Citrucel) ?Calcium polycarbophil (FiberCon; Fiber-Lax; Mitrolan) ?Wheat dextrin (Benefiber) You should increase the dose of fiber supplements slowly to prevent gas and cramping, and you should always take the supplement with plenty of fluid.  Hyperosmolar laxatives - Hyperosmolar laxatives include: ?Polyethylene glycol (MiraLax, Glycolax) ?Lactulose ?Sorbitol Polyethylene glycol is generally preferred since it does not cause gas or bloating and is available in the [place] without a prescription. Lactulose and sorbitol can produce gas and bloating. Sorbitol works as well as lactulose and is much less expensive.  Saline laxatives - Saline laxatives such as magnesium hydroxide (Milk of Magnesia) and magnesium citrate (Evac-Q-Mag) act similarly to the hyperosmolar laxatives.  Stimulant laxatives - Stimulant laxatives include senna (eg, Black Draught, Ex-lax, Fletcher's, Castoria, Senokot) and  bisacodyl (eg, Correctol, Doxidan, Dulcolax). Some people overuse stimulant laxatives. Taking stimulant laxatives regularly or in large amounts can cause side effects, including low potassium levels. Thus, you should take these drugs carefully if you must use them regularly. However, there is no convincing evidence that using stimulant laxatives regularly damages the colon, and they do not increase the risk for colorectal cancer or other tumors.  New treatments - Lubiprostone (Amitiza) is a prescription medication that treats severe constipation. It is expensive, but it may be recommended if you do not respond to other treatments. Linaclotide (Linzess) is another prescription medication that has been approved for the treatment of chronic constipation. It is also an expensive medication as compared with other agents with the exception of lubiprostone. Linaclotide may be recommended if you do not respond to other treatments.  Constipation treatments to avoid ?Emollients - Emollient laxatives, principally mineral oil, soften stools by moisturizing them. However, other treatments have fewer risks and  equal benefit. ?Natural products - A wide variety of natural products are advertised for constipation. Some of them contain the active ingredients found in commercially available laxatives. However, their dose and purity may not be carefully controlled. Thus, these products are not generally recommended. ?A variety of home-made enema preparations have been used throughout the years, such as soapsuds, hydrogen peroxide, and household detergents. These can be extremely irritating to the lining of the intestine and should be avoided.        Visit summary with medication list and pertinent instructions was printed for patient to review. All questions at time of visit were answered - patient instructed to contact office with any additional concerns or updates. ER/RTC precautions were reviewed with the patient.    Total time spent 25 mins >50%  Counseling on The encounter diagnosis was Decreased frequency of bowel movements.   Please note: voice recognition software was used to produce this document, and typos may escape review. Please contact Dr. Sheppard Coil for any needed clarifications.     Follow-up plan: Return if symptoms worsen or fail to improve please see Dr Madilyn Fireman .

## 2018-12-01 DIAGNOSIS — R194 Change in bowel habit: Secondary | ICD-10-CM | POA: Diagnosis not present

## 2018-12-02 LAB — COMPLETE METABOLIC PANEL WITH GFR
AG Ratio: 1.7 (calc) (ref 1.0–2.5)
ALT: 18 U/L (ref 6–29)
AST: 24 U/L (ref 10–35)
Albumin: 4.5 g/dL (ref 3.6–5.1)
Alkaline phosphatase (APISO): 80 U/L (ref 37–153)
BUN/Creatinine Ratio: 11 (calc) (ref 6–22)
BUN: 12 mg/dL (ref 7–25)
CO2: 26 mmol/L (ref 20–32)
Calcium: 9.9 mg/dL (ref 8.6–10.4)
Chloride: 106 mmol/L (ref 98–110)
Creat: 1.06 mg/dL — ABNORMAL HIGH (ref 0.60–0.93)
GFR, Est African American: 59 mL/min/{1.73_m2} — ABNORMAL LOW (ref >=60)
GFR, Est Non African American: 51 mL/min/{1.73_m2} — ABNORMAL LOW (ref >=60)
GLOBULIN: 2.6 g/dL (ref 1.9–3.7)
Glucose, Bld: 100 mg/dL — ABNORMAL HIGH (ref 65–99)
Potassium: 3.9 mmol/L (ref 3.5–5.3)
Sodium: 143 mmol/L (ref 135–146)
Total Bilirubin: 0.7 mg/dL (ref 0.2–1.2)
Total Protein: 7.1 g/dL (ref 6.1–8.1)

## 2018-12-02 LAB — CBC
HCT: 45.1 % — ABNORMAL HIGH (ref 35.0–45.0)
Hemoglobin: 14.9 g/dL (ref 11.7–15.5)
MCH: 28.9 pg (ref 27.0–33.0)
MCHC: 33 g/dL (ref 32.0–36.0)
MCV: 87.6 fL (ref 80.0–100.0)
MPV: 11.6 fL (ref 7.5–12.5)
Platelets: 225 10*3/uL (ref 140–400)
RBC: 5.15 10*6/uL — ABNORMAL HIGH (ref 3.80–5.10)
RDW: 13.1 % (ref 11.0–15.0)
WBC: 6.3 10*3/uL (ref 3.8–10.8)

## 2018-12-02 LAB — TSH: TSH: 1.84 mIU/L (ref 0.40–4.50)

## 2018-12-11 ENCOUNTER — Ambulatory Visit: Payer: Medicare HMO | Admitting: Neurology

## 2018-12-18 ENCOUNTER — Other Ambulatory Visit: Payer: Self-pay | Admitting: Family Medicine

## 2019-03-25 ENCOUNTER — Other Ambulatory Visit: Payer: Self-pay | Admitting: Family Medicine

## 2019-05-21 ENCOUNTER — Ambulatory Visit (INDEPENDENT_AMBULATORY_CARE_PROVIDER_SITE_OTHER): Payer: Medicare HMO | Admitting: Family Medicine

## 2019-05-21 ENCOUNTER — Other Ambulatory Visit: Payer: Self-pay

## 2019-05-21 ENCOUNTER — Encounter: Payer: Self-pay | Admitting: Family Medicine

## 2019-05-21 VITALS — BP 136/75 | HR 76 | Ht 61.0 in | Wt 132.0 lb

## 2019-05-21 DIAGNOSIS — N183 Chronic kidney disease, stage 3 unspecified: Secondary | ICD-10-CM

## 2019-05-21 DIAGNOSIS — I10 Essential (primary) hypertension: Secondary | ICD-10-CM

## 2019-05-21 DIAGNOSIS — R7301 Impaired fasting glucose: Secondary | ICD-10-CM | POA: Diagnosis not present

## 2019-05-21 LAB — POCT GLYCOSYLATED HEMOGLOBIN (HGB A1C): Hemoglobin A1C: 5.6 % (ref 4.0–5.6)

## 2019-05-21 MED ORDER — AMLODIPINE BESYLATE 10 MG PO TABS: 10.0000 mg | ORAL_TABLET | Freq: Every day | ORAL | 1 refills | Status: DC

## 2019-05-21 NOTE — Assessment & Plan Note (Signed)
A1c looks fantastic at 5.6 today we will plan to recheck again in 1 year.

## 2019-05-21 NOTE — Progress Notes (Signed)
Established Patient Office Visit  Subjective:  Patient ID: Linda Werner, female    DOB: 07/24/1941  Age: 78 y.o. MRN: [id number]  CC:  Chief Complaint  Patient presents with  . Hypertension  . ifg  . Nevus    pt has a mole on her L forearm that she reports to have gotten harder     HPI Linda Werner presents for   Hypertension- Pt denies chest pain, SOB, dizziness, or heart palpitations.  Taking meds as directed w/o problems.  Denies medication side effects.  Says she quit taking her losartan because she was still worried about potential side effects sent some of it was recalled from the market.  She said she also cut back on eating meat and has lost some weight on purpose.  Impaired fasting glucose-no increased thirst or urination. No symptoms consistent with hypoglycemia.  F/U CKD 3- no recent changes.   Past Medical History:  Diagnosis Date  . Allergy   . Anxiety   . Arthritis not diagnosed  . Cataract   . CKD (chronic kidney disease) stage 3, GFR 30-59 ml/min (HCC)   . Heart murmur last visit  . Hx of adenomatous polyp of colon 12/04/2016  . Hypercholesteremia   . Hypertension   . Neuromuscular disorder ([place]) not diagnosed  . Osteoporosis     Past Surgical History:  Procedure Laterality Date  . ABDOMINAL HYSTERECTOMY    . BREAST BIOPSY  2011   left  . COLONOSCOPY      Family History  Problem Relation Age of Onset  . Hypertension Unknown   . Arthritis Sister   . Cancer Sister   . Early death Daughter   . Colon cancer Neg Hx     Social History   Socioeconomic History  . Marital status: Divorced    Spouse name: Not on file  . Number of children: 1  . Years of education: Not on file  . Highest education level: Not on file  Occupational History  . Occupation: Retired Education officer, museum  . Financial resource strain: Not on file  . Food insecurity    Worry: Not on file    Inability: Not on file  . Transportation needs    Medical: Not on  file    Non-medical: Not on file  Tobacco Use  . Smoking status: Never Smoker  . Smokeless tobacco: Never Used  Substance and Sexual Activity  . Alcohol use: No  . Drug use: No  . Sexual activity: Never  Lifestyle  . Physical activity    Days per week: Not on file    Minutes per session: Not on file  . Stress: Not on file  Relationships  . Social Herbalist on phone: Not on file    Gets together: Not on file    Attends religious service: Not on file    Active member of club or organization: Not on file    Attends meetings of clubs or organizations: Not on file    Relationship status: Not on file  . Intimate partner violence    Fear of current or ex partner: Not on file    Emotionally abused: Not on file    Physically abused: Not on file    Forced sexual activity: Not on file  Other Topics Concern  . Not on file  Social History Narrative   Exercises regularly.  No caffeine.     Outpatient Medications Prior to Visit  Medication  Sig Dispense Refill  . calcium carbonate (OS-CAL) 600 MG TABS Take 600 mg by mouth 2 (two) times daily with a meal.    . Multiple Vitamins-Minerals (CENTRUM SILVER PO) Take 1 tablet by mouth once.    . risedronate (ACTONEL) 35 MG tablet Take 1 tablet (35 mg total) by mouth every 7 (seven) days. with water on empty stomach, nothing by mouth or lie down for next 30 minutes. 12 tablet 3  . Vitamin D, Cholecalciferol, 400 UNITS CHEW Chew by mouth.    Marland Kitchen amLODipine (NORVASC) 10 MG tablet Take 1 tablet (10 mg total) by mouth daily. LAST REFILL. MUST MAKE APPT 90 tablet 0  . betamethasone dipropionate (DIPROLENE) 0.05 % cream Apply topically 2 (two) times daily. To affected area(s) as needed for up to two weeks (Patient not taking: Reported on 11/25/2018) 45 g 0  . ciprofloxacin (CIPRO) 500 MG tablet Take 1 tablet (500 mg total) by mouth 2 (two) times daily. One po bid x 7 days (Patient not taking: Reported on 11/25/2018) 14 tablet 0  . losartan (COZAAR)  100 MG tablet Take 1 tablet (100 mg total) by mouth daily. (Patient not taking: Reported on 11/25/2018) 90 tablet 1  . metroNIDAZOLE (FLAGYL) 500 MG tablet Take 1 tablet (500 mg total) by mouth 2 (two) times daily. One po bid x 7 days (Patient not taking: Reported on 11/25/2018) 14 tablet 0   No facility-administered medications prior to visit.     Allergies  Allergen Reactions  . Alendronate Sodium     REACTION: rash  . Atorvastatin Other (See Comments)    Myalgias.    . Benazepril Cough  . Statins     REACTION: myalgias  . Verapamil Hcl     REACTION: swelling    ROS Review of Systems    Objective:    Physical Exam  Constitutional: She is oriented to person, place, and time. She appears well-developed and well-nourished.  HENT:  Head: Normocephalic and atraumatic.  Cardiovascular: Normal rate, regular rhythm and normal heart sounds.  Pulmonary/Chest: Effort normal and breath sounds normal.  Neurological: She is alert and oriented to person, place, and time.  Skin: Skin is warm and dry.  Psychiatric: She has a normal mood and affect. Her behavior is normal.    BP 136/75   Pulse 76   Ht 5\' 1"  (1.549 m)   Wt 132 lb (59.9 kg)   SpO2 95%   BMI 24.94 kg/m  Wt Readings from Last 3 Encounters:  05/21/19 132 lb (59.9 kg)  11/25/18 133 lb 9.6 oz (60.6 kg)  09/19/18 141 lb (64 kg)     There are no preventive care reminders to display for this patient.  There are no preventive care reminders to display for this patient.  Lab Results  Component Value Date   TSH 1.84 12/01/2018   Lab Results  Component Value Date   WBC 6.3 12/01/2018   HGB 14.9 12/01/2018   HCT 45.1 (H) 12/01/2018   MCV 87.6 12/01/2018   PLT 225 12/01/2018   Lab Results  Component Value Date   NA 143 12/01/2018   K 3.9 12/01/2018   CO2 26 12/01/2018   GLUCOSE 100 (H) 12/01/2018   BUN 12 12/01/2018   CREATININE 1.06 (H) 12/01/2018   BILITOT 0.7 12/01/2018   ALKPHOS 73 09/19/2018   AST 24  12/01/2018   ALT 18 12/01/2018   PROT 7.1 12/01/2018   ALBUMIN 4.1 09/19/2018   CALCIUM 9.9 12/01/2018  ANIONGAP 9 09/19/2018   Lab Results  Component Value Date   CHOL 236 (H) 10/29/2017   Lab Results  Component Value Date   HDL 54 10/29/2017   Lab Results  Component Value Date   LDLCALC 148 (H) 10/29/2017   Lab Results  Component Value Date   TRIG 195 (H) 10/29/2017   Lab Results  Component Value Date   CHOLHDL 4.4 10/29/2017   Lab Results  Component Value Date   HGBA1C 5.6 05/21/2019      Assessment & Plan:   Problem List Items Addressed This Visit      Cardiovascular and Mediastinum   ESSENTIAL HYPERTENSION, BENIGN    BP is OK overall. A little lower when on medicaiton. She is very leary about losartan and would prefer not to take it.  I explained was some manufactured losartan was pulled from the market that it was more of a contamination versus the actual medication that she would rather be without it and says she is been monitoring her blood pressures and they have been good.      Relevant Medications   amLODipine (NORVASC) 10 MG tablet   Other Relevant Orders   POCT glycosylated hemoglobin (Hb A1C) (Completed)   BASIC METABOLIC PANEL WITH GFR   Urine Microalbumin w/creat. ratio   Lipid Panel w/reflex Direct LDL     Endocrine   IMPAIRED FASTING GLUCOSE - Primary    A1c looks fantastic at 5.6 today we will plan to recheck again in 1 year.      Relevant Orders   POCT glycosylated hemoglobin (Hb A1C) (Completed)   BASIC METABOLIC PANEL WITH GFR   Urine Microalbumin w/creat. ratio   Lipid Panel w/reflex Direct LDL     Genitourinary   CKD (chronic kidney disease) stage 3, GFR 30-59 ml/min (HCC)    Due to recheck renal function.  Consider restarting in [place] ACE inhibitor if needed.  Remind her that this medicine is also for kidney protection.      Relevant Orders   BASIC METABOLIC PANEL WITH GFR   Urine Microalbumin w/creat. ratio   Lipid Panel  w/reflex Direct LDL      Meds ordered this encounter  Medications  . amLODipine (NORVASC) 10 MG tablet    Sig: Take 1 tablet (10 mg total) by mouth daily.    Dispense:  90 tablet    Refill:  1    Follow-up: Return in about 6 months (around 11/21/2019) for Hypertension.    Beatrice Lecher, MD

## 2019-05-21 NOTE — Assessment & Plan Note (Signed)
Due to recheck renal function.  Consider restarting in [place] ACE inhibitor if needed.  Remind her that this medicine is also for kidney protection.

## 2019-05-21 NOTE — Assessment & Plan Note (Signed)
BP is OK overall. A little lower when on medicaiton. She is very leary about losartan and would prefer not to take it.  I explained was some manufactured losartan was pulled from the market that it was more of a contamination versus the actual medication that she would rather be without it and says she is been monitoring her blood pressures and they have been good.

## 2019-05-22 LAB — BASIC METABOLIC PANEL WITH GFR
BUN/Creatinine Ratio: 14 (calc) (ref 6–22)
BUN: 14 mg/dL (ref 7–25)
CO2: 24 mmol/L (ref 20–32)
Calcium: 10.2 mg/dL (ref 8.6–10.4)
Chloride: 106 mmol/L (ref 98–110)
Creat: 0.99 mg/dL — ABNORMAL HIGH (ref 0.60–0.93)
GFR, Est African American: 64 mL/min/{1.73_m2} (ref >=60)
GFR, Est Non African American: 55 mL/min/{1.73_m2} — ABNORMAL LOW (ref >=60)
Glucose, Bld: 98 mg/dL (ref 65–99)
Potassium: 3.7 mmol/L (ref 3.5–5.3)
Sodium: 142 mmol/L (ref 135–146)

## 2019-05-22 LAB — LIPID PANEL W/REFLEX DIRECT LDL
Cholesterol: 223 mg/dL — ABNORMAL HIGH (ref <200)
HDL: 51 mg/dL (ref >=50)
LDL Cholesterol (Calc): 138 mg/dL (calc) — ABNORMAL HIGH
Non-HDL Cholesterol (Calc): 172 mg/dL (calc) — ABNORMAL HIGH (ref <130)
Total CHOL/HDL Ratio: 4.4 (calc) (ref <5.0)
Triglycerides: 205 mg/dL — ABNORMAL HIGH (ref <150)

## 2019-05-22 LAB — MICROALBUMIN / CREATININE URINE RATIO
Creatinine, Urine: 252 mg/dL (ref 20–275)
Microalb Creat Ratio: 9 mcg/mg creat (ref <30)
Microalb, Ur: 2.3 mg/dL

## 2019-06-04 DIAGNOSIS — Z1231 Encounter for screening mammogram for malignant neoplasm of breast: Secondary | ICD-10-CM | POA: Diagnosis not present

## 2019-06-04 LAB — HM MAMMOGRAPHY

## 2019-06-21 ENCOUNTER — Other Ambulatory Visit: Payer: Self-pay | Admitting: Family Medicine

## 2019-07-28 ENCOUNTER — Other Ambulatory Visit: Payer: Self-pay

## 2019-07-28 ENCOUNTER — Ambulatory Visit (INDEPENDENT_AMBULATORY_CARE_PROVIDER_SITE_OTHER): Payer: Medicare HMO | Admitting: Family Medicine

## 2019-07-28 DIAGNOSIS — Z23 Encounter for immunization: Secondary | ICD-10-CM | POA: Diagnosis not present

## 2019-09-02 ENCOUNTER — Encounter: Payer: Self-pay | Admitting: Family Medicine

## 2019-09-06 ENCOUNTER — Encounter: Payer: Self-pay | Admitting: Family Medicine

## 2019-09-07 NOTE — Telephone Encounter (Signed)
Called patient. Advised her that screening was done prior to appointments- she does not have upcoming appt, was just curious.   Let pt know we do have mammogram results.  No further needs at this time

## 2019-11-03 NOTE — Progress Notes (Deleted)
Subjective:   Linda Werner is a 79 y.o. female who presents for Medicare Annual (Subsequent) preventive examination.  Review of Systems:  No ROS.  Medicare Wellness Virtual Visit.  Visual/audio telehealth visit, UTA vital signs.   See social history for additional risk factors.      Sleep patterns:   Home Safety/Smoke Alarms: Feels safe in home. Smoke alarms in place.  Living environment; Seat Belt Safety/Bike Helmet: Wears seat belt.   Female:   Pap- Aged out      Mammo- UTD      Dexa scan- UTD       CCS- Aged out    Objective:     Vitals: There were no vitals taken for this visit.  There is no height or weight on file to calculate BMI.  Advanced Directives 09/19/2018 11/29/2016 11/15/2016 08/08/2015  Does Patient Have a Medical Advance Directive? No No No No  Would patient like information on creating a medical advance directive? - - - No - patient declined information    Tobacco Social History   Tobacco Use  Smoking Status Never Smoker  Smokeless Tobacco Never Used     Counseling given: Not Answered   Clinical Intake:                       Past Medical History:  Diagnosis Date  . Allergy   . Anxiety   . Arthritis not diagnosed  . Cataract   . CKD (chronic kidney disease) stage 3, GFR 30-59 ml/min (HCC)   . Heart murmur last visit  . Hx of adenomatous polyp of colon 12/04/2016  . Hypercholesteremia   . Hypertension   . Neuromuscular disorder ([place]) not diagnosed  . Osteoporosis    Past Surgical History:  Procedure Laterality Date  . ABDOMINAL HYSTERECTOMY    . BREAST BIOPSY  2011   left  . COLONOSCOPY     Family History  Problem Relation Age of Onset  . Hypertension Unknown   . Arthritis Sister   . Cancer Sister   . Early death Daughter   . Colon cancer Neg Hx    Social History   Socioeconomic History  . Marital status: Divorced    Spouse name: Not on file  . Number of children: 1  . Years of education: Not on file  .  Highest education level: Not on file  Occupational History  . Occupation: Retired Corporate treasurer  Tobacco Use  . Smoking status: Never Smoker  . Smokeless tobacco: Never Used  Substance and Sexual Activity  . Alcohol use: No  . Drug use: No  . Sexual activity: Never  Other Topics Concern  . Not on file  Social History Narrative   Exercises regularly.  No caffeine.    Social Determinants of Health   Financial Resource Strain:   . Difficulty of Paying Living Expenses: Not on file  Food Insecurity:   . Worried About Charity fundraiser in the Last Year: Not on file  . Ran Out of Food in the Last Year: Not on file  Transportation Needs:   . Lack of Transportation (Medical): Not on file  . Lack of Transportation (Non-Medical): Not on file  Physical Activity:   . Days of Exercise per Week: Not on file  . Minutes of Exercise per Session: Not on file  Stress:   . Feeling of Stress : Not on file  Social Connections:   . Frequency of Communication with Friends and  Family: Not on file  . Frequency of Social Gatherings with Friends and Family: Not on file  . Attends Religious Services: Not on file  . Active Member of Clubs or Organizations: Not on file  . Attends Archivist Meetings: Not on file  . Marital Status: Not on file    Outpatient Encounter Medications as of 11/09/2019  Medication Sig  . amLODipine (NORVASC) 10 MG tablet Take 1 tablet (10 mg total) by mouth daily.  . calcium carbonate (OS-CAL) 600 MG TABS Take 600 mg by mouth 2 (two) times daily with a meal.  . Multiple Vitamins-Minerals (CENTRUM SILVER PO) Take 1 tablet by mouth once.  . risedronate (ACTONEL) 35 MG tablet TAKE 1 TABLET EVERY 7 DAYS WITH WATER ON AN EMPTY STOMACH, NOTHING BY MOUTH OR LIE DOWN FOR NEXT 30 MINUTES  . Vitamin D, Cholecalciferol, 400 UNITS CHEW Chew by mouth.   No facility-administered encounter medications on file as of 11/09/2019.    Activities of Daily Living No flowsheet data  found.  Patient Care Team: Hali Marry, MD as PCP - General (Family Medicine)    Assessment:   This is a routine wellness examination for Shamicka.Physical assessment deferred to PCP.   Exercise Activities and Dietary recommendations   Diet  Breakfast: Lunch:  Dinner:       Goals   None     Fall Risk Fall Risk  05/21/2019 07/06/2018 10/27/2017 06/07/2016 03/15/2015  Falls in the past year? 0 Yes Yes No No  Number falls in past yr: 0 1 1 - -  Injury with Fall? 0 Yes No - -  Risk for fall due to : - Other (Comment) - - -  Risk for fall due to: Comment - uneven surface - - -  Follow up - Falls prevention discussed - - -   Is the patient's home free of loose throw rugs in walkways, pet beds, electrical cords, etc?   {Blank single:19197::"yes","no"}      Grab bars in the bathroom? {Blank single:19197::"yes","no"}      Handrails on the stairs?   {Blank single:19197::"yes","no"}      Adequate lighting?   {Blank single:19197::"yes","no"}   Depression Screen PHQ 2/9 Scores 05/21/2019 07/06/2018 10/27/2017 06/07/2016  PHQ - 2 Score 0 1 0 0     Cognitive Function        Immunization History  Administered Date(s) Administered  . Fluad Quad(high Dose 65+) 07/28/2019  . Influenza Split 09/02/2012  . Influenza Whole 09/01/2008  . Influenza, High Dose Seasonal PF 06/18/2018  . Influenza,inj,Quad PF,6+ Mos 09/09/2013, 09/09/2014, 12/21/2015, 06/07/2016, 08/25/2017  . Pneumococcal Conjugate-13 12/09/2016  . Pneumococcal Polysaccharide-23 10/21/2008  . Tdap 12/28/2012    Screening Tests Health Maintenance  Topic Date Due  . TETANUS/TDAP  12/29/2022  . INFLUENZA VACCINE  Completed  . DEXA SCAN  Completed  . PNA vac Low Risk Adult  Completed        Plan:   ***   I have personally reviewed and noted the following in the patient's chart:   . Medical and social history . Use of alcohol, tobacco or illicit drugs  . Current medications and supplements . Functional  ability and status . Nutritional status . Physical activity . Advanced directives . List of other physicians . Hospitalizations, surgeries, and ER visits in previous 12 months . Vitals . Screenings to include cognitive, depression, and falls . Referrals and appointments  In addition, I have reviewed and discussed with patient certain preventive  protocols, quality metrics, and best practice recommendations. A written personalized care plan for preventive services as well as general preventive health recommendations were provided to patient.     Joanne Chars, LPN  [phone]

## 2019-11-17 ENCOUNTER — Other Ambulatory Visit: Payer: Self-pay | Admitting: Family Medicine

## 2019-11-17 DIAGNOSIS — I10 Essential (primary) hypertension: Secondary | ICD-10-CM

## 2019-11-19 NOTE — Progress Notes (Signed)
Subjective:   Linda Werner is a 79 y.o. female who presents for Medicare Annual (Subsequent) preventive examination.  Review of Systems:  No ROS.  Medicare Wellness Virtual Visit.  Visual/audio telehealth visit, UTA vital signs.   See social history for additional risk factors.    Cardiac Risk Factors include: advanced age (>65men, >49 women);hypertension Sleep patterns:  Getting 7 hours of sleep a night.Wakes up 3 times a night to void. Wakes up and feels rested and ready for the day.  Home Safety/Smoke Alarms: Feels safe in home. Smoke alarms in place.  Living environment; Lives alone in 1 story home no stairs in or around the home. Shower is a step over tub combo and anti slip mat in place. Seat Belt Safety/Bike Helmet: Wears seat belt.   Female:   Pap- Aged out      Mammo- UTD      Dexa scan-  UTD      CCS- UTD    Objective:     Vitals: BP 137/68   Pulse 84   Ht 5\' 1"  (1.549 m)   Wt 141 lb (64 kg)   SpO2 98%   BMI 26.64 kg/m   Body mass index is 26.64 kg/m.  Advanced Directives 11/23/2019 09/19/2018 11/29/2016 11/15/2016 08/08/2015  Does Patient Have a Medical Advance Directive? No No No No No  Would patient like information on creating a medical advance directive? No - Patient declined - - - No - patient declined information    Tobacco Social History   Tobacco Use  Smoking Status Never Smoker  Smokeless Tobacco Never Used     Counseling given: Not Answered   Clinical Intake:  Pre-visit preparation completed: Yes  Pain : No/denies pain     Nutritional Risks: None Diabetes: No  How often do you need to have someone help you when you read instructions, pamphlets, or other written materials from your doctor or pharmacy?: 1 - Never What is the last grade level you completed in school?: 15  Interpreter Needed?: No  Information entered by :: Orlie Dakin, LPN  Past Medical History:  Diagnosis Date  . Allergy   . Anxiety   . Arthritis not  diagnosed  . Cataract   . CKD (chronic kidney disease) stage 3, GFR 30-59 ml/min   . Heart murmur last visit  . Hx of adenomatous polyp of colon 12/04/2016  . Hypercholesteremia   . Hypertension   . Neuromuscular disorder ([place]) not diagnosed  . Osteoporosis    Past Surgical History:  Procedure Laterality Date  . ABDOMINAL HYSTERECTOMY    . BREAST BIOPSY  2011   left  . COLONOSCOPY     Family History  Problem Relation Age of Onset  . Hypertension Other   . Arthritis Sister   . Cancer Sister   . Early death Daughter   . Colon cancer Neg Hx    Social History   Socioeconomic History  . Marital status: Divorced    Spouse name: Not on file  . Number of children: 1  . Years of education: 3  . Highest education level: Some college, no degree  Occupational History  . Occupation: Retired Corporate treasurer  Tobacco Use  . Smoking status: Never Smoker  . Smokeless tobacco: Never Used  Substance and Sexual Activity  . Alcohol use: No  . Drug use: No  . Sexual activity: Never  Other Topics Concern  . Not on file  Social History Narrative   Exercises regularly.  No  caffeine.    Social Determinants of Health   Financial Resource Strain:   . Difficulty of Paying Living Expenses: Not on file  Food Insecurity:   . Worried About Charity fundraiser in the Last Year: Not on file  . Ran Out of Food in the Last Year: Not on file  Transportation Needs:   . Lack of Transportation (Medical): Not on file  . Lack of Transportation (Non-Medical): Not on file  Physical Activity:   . Days of Exercise per Week: Not on file  . Minutes of Exercise per Session: Not on file  Stress:   . Feeling of Stress : Not on file  Social Connections:   . Frequency of Communication with Friends and Family: Not on file  . Frequency of Social Gatherings with Friends and Family: Not on file  . Attends Religious Services: Not on file  . Active Member of Clubs or Organizations: Not on file  . Attends Theatre manager Meetings: Not on file  . Marital Status: Not on file    Outpatient Encounter Medications as of 11/23/2019  Medication Sig  . amLODipine (NORVASC) 10 MG tablet TAKE 1 TABLET (10 MG TOTAL) BY MOUTH DAILY.  . calcium carbonate (OS-CAL) 600 MG TABS Take 600 mg by mouth 2 (two) times daily with a meal.  . Multiple Vitamins-Minerals (CENTRUM SILVER PO) Take 1 tablet by mouth once.  . risedronate (ACTONEL) 35 MG tablet TAKE 1 TABLET EVERY 7 DAYS WITH WATER ON AN EMPTY STOMACH, NOTHING BY MOUTH OR LIE DOWN FOR NEXT 30 MINUTES  . Vitamin D, Cholecalciferol, 400 UNITS CHEW Chew by mouth.   No facility-administered encounter medications on file as of 11/23/2019.    Activities of Daily Living In your present state of health, do you have any difficulty performing the following activities: 11/23/2019  Hearing? N  Vision? N  Difficulty concentrating or making decisions? N  Walking or climbing stairs? N  Dressing or bathing? N  Doing errands, shopping? N  Preparing Food and eating ? N  Using the Toilet? N  In the past six months, have you accidently leaked urine? N  Do you have problems with loss of bowel control? N  Managing your Medications? N  Managing your Finances? N  Housekeeping or managing your Housekeeping? N  Some recent data might be hidden    Patient Care Team: Hali Marry, MD as PCP - General (Family Medicine)    Assessment:   This is a routine wellness examination for Jawanda.Physical assessment deferred to PCP.   Exercise Activities and Dietary recommendations Current Exercise Habits: Home exercise routine, Type of exercise: stretching(stationary bike and sitting exercises for back), Time (Minutes): 30, Frequency (Times/Week): 3, Weekly Exercise (Minutes/Week): 90, Intensity: Mild, Exercise limited by: None identified Diet Eats a healthy diet of vegetables, fruit and protein. Breakfast: Fruit, eggs Lunch: salad or sandwich Dinner: Meat and  vegetables Drinks 32 ounces of water daily      Goals    . Exercise 3x per week (30 min per time)     Do specific exercises to focus on balance so this does not become a problem.       Fall Risk Fall Risk  11/23/2019 05/21/2019 07/06/2018 10/27/2017 06/07/2016  Falls in the past year? 1 0 Yes Yes No  Number falls in past yr: 0 0 1 1 -  Injury with Fall? 0 0 Yes No -  Risk for fall due to : No Fall Risks - Other (  Comment) - -  Risk for fall due to: Comment - - uneven surface - -  Follow up Falls prevention discussed - Falls prevention discussed - -   Is the patient's home free of loose throw rugs in walkways, pet beds, electrical cords, etc?   yes      Grab bars in the bathroom? no      Handrails on the stairs?   no      Adequate lighting?   yes   Depression Screen PHQ 2/9 Scores 11/23/2019 05/21/2019 07/06/2018 10/27/2017  PHQ - 2 Score 0 0 1 0     Cognitive Function     6CIT Screen 11/23/2019  What Year? 0 points  What month? 3 points  What time? 3 points  Count back from 20 0 points  Months in reverse 0 points  Repeat phrase 0 points  Total Score 6    Immunization History  Administered Date(s) Administered  . Fluad Quad(high Dose 65+) 07/28/2019  . Influenza Split 09/02/2012  . Influenza Whole 09/01/2008  . Influenza, High Dose Seasonal PF 06/18/2018  . Influenza,inj,Quad PF,6+ Mos 09/09/2013, 09/09/2014, 12/21/2015, 06/07/2016, 08/25/2017  . Pneumococcal Conjugate-13 12/09/2016  . Pneumococcal Polysaccharide-23 10/21/2008  . Tdap 12/28/2012    Screening Tests Health Maintenance  Topic Date Due  . TETANUS/TDAP  12/29/2022  . INFLUENZA VACCINE  Completed  . DEXA SCAN  Completed  . PNA vac Low Risk Adult  Completed        Plan:    Please schedule your next medicare wellness visit with me in 1 yr.  Ms. Rusher , Thank you for taking time to come for your Medicare Wellness Visit. I appreciate your ongoing commitment to your health goals. Please review the following  plan we discussed and let me know if I can assist you in the future.  Continue doing brain stimulating activities (puzzles, reading, adult coloring books, staying active) to keep memory sharp.   These are the goals we discussed: Goals    . Exercise 3x per week (30 min per time)     Do specific exercises to focus on balance so this does not become a problem.       This is a list of the screening recommended for you and due dates:  Health Maintenance  Topic Date Due  . Tetanus Vaccine  12/29/2022  . Flu Shot  Completed  . DEXA scan (bone density measurement)  Completed  . Pneumonia vaccines  Completed      I have personally reviewed and noted the following in the patient's chart:   . Medical and social history . Use of alcohol, tobacco or illicit drugs  . Current medications and supplements . Functional ability and status . Nutritional status . Physical activity . Advanced directives . List of other physicians . Hospitalizations, surgeries, and ER visits in previous 12 months . Vitals . Screenings to include cognitive, depression, and falls . Referrals and appointments  In addition, I have reviewed and discussed with patient certain preventive protocols, quality metrics, and best practice recommendations. A written personalized care plan for preventive services as well as general preventive health recommendations were provided to patient.     Joanne Chars, LPN  [id number]

## 2019-11-23 ENCOUNTER — Ambulatory Visit (INDEPENDENT_AMBULATORY_CARE_PROVIDER_SITE_OTHER): Payer: Medicare HMO | Admitting: *Deleted

## 2019-11-23 ENCOUNTER — Other Ambulatory Visit: Payer: Self-pay

## 2019-11-23 VITALS — BP 137/68 | HR 84 | Ht 61.0 in | Wt 141.0 lb

## 2019-11-23 DIAGNOSIS — Z0000 Encounter for general adult medical examination without abnormal findings: Secondary | ICD-10-CM | POA: Diagnosis not present

## 2019-11-23 NOTE — Patient Instructions (Addendum)
Please schedule your next medicare wellness visit with me in 1 yr.  Linda Werner , Thank you for taking time to come for your Medicare Wellness Visit. I appreciate your ongoing commitment to your health goals. Please review the following plan we discussed and let me know if I can assist you in the future.  Continue doing brain stimulating activities (puzzles, reading, adult coloring books, staying active) to keep memory sharp.  These are the goals we discussed: Goals    . Exercise 3x per week (30 min per time)     Do specific exercises to focus on balance so this does not become a problem.

## 2019-11-24 ENCOUNTER — Telehealth: Payer: Self-pay | Admitting: Family Medicine

## 2019-11-24 ENCOUNTER — Ambulatory Visit (INDEPENDENT_AMBULATORY_CARE_PROVIDER_SITE_OTHER): Payer: Medicare HMO | Admitting: Family Medicine

## 2019-11-24 ENCOUNTER — Encounter: Payer: Self-pay | Admitting: Family Medicine

## 2019-11-24 VITALS — BP 135/69 | HR 79 | Ht 61.0 in | Wt 141.0 lb

## 2019-11-24 DIAGNOSIS — N1831 Chronic kidney disease, stage 3a: Secondary | ICD-10-CM | POA: Diagnosis not present

## 2019-11-24 DIAGNOSIS — R011 Cardiac murmur, unspecified: Secondary | ICD-10-CM | POA: Diagnosis not present

## 2019-11-24 DIAGNOSIS — I10 Essential (primary) hypertension: Secondary | ICD-10-CM | POA: Diagnosis not present

## 2019-11-24 DIAGNOSIS — M810 Age-related osteoporosis without current pathological fracture: Secondary | ICD-10-CM

## 2019-11-24 DIAGNOSIS — R7301 Impaired fasting glucose: Secondary | ICD-10-CM

## 2019-11-24 LAB — BASIC METABOLIC PANEL WITH GFR
BUN/Creatinine Ratio: 13 (calc) (ref 6–22)
BUN: 13 mg/dL (ref 7–25)
CO2: 29 mmol/L (ref 20–32)
Calcium: 10.1 mg/dL (ref 8.6–10.4)
Chloride: 104 mmol/L (ref 98–110)
Creat: 0.97 mg/dL — ABNORMAL HIGH (ref 0.60–0.93)
GFR, Est African American: 66 mL/min/{1.73_m2} (ref >=60)
GFR, Est Non African American: 57 mL/min/{1.73_m2} — ABNORMAL LOW (ref >=60)
Glucose, Bld: 103 mg/dL — ABNORMAL HIGH (ref 65–99)
Potassium: 3.7 mmol/L (ref 3.5–5.3)
Sodium: 141 mmol/L (ref 135–146)

## 2019-11-24 LAB — POCT GLYCOSYLATED HEMOGLOBIN (HGB A1C): Hemoglobin A1C: 5.8 % — AB (ref 4.0–5.6)

## 2019-11-24 NOTE — Assessment & Plan Note (Signed)
Well controlled. Continue current regimen. Follow up in  6 mo  

## 2019-11-24 NOTE — Addendum Note (Signed)
Addended by: Beatrice Lecher D on: 11/24/2019 05:00 PM   Modules accepted: Orders

## 2019-11-24 NOTE — Assessment & Plan Note (Signed)
Well controlled. Continue current regimen. Follow up in  6 mo . Due for BMP 

## 2019-11-24 NOTE — Assessment & Plan Note (Signed)
Due to recheck renal funciton.

## 2019-11-24 NOTE — Assessment & Plan Note (Signed)
Plan to schedule Echo

## 2019-11-24 NOTE — Progress Notes (Addendum)
Established Patient Office Visit  Subjective:  Patient ID: Linda Werner, female    DOB: 1941-07-24  Age: 79 y.o. MRN: [id number]  CC:  Chief Complaint  Patient presents with  . Hypertension  . ifg    HPI Linda Werner presents for   Hypertension- Pt denies chest pain, SOB, dizziness, or heart palpitations.  Taking meds as directed w/o problems.  Denies medication side effects.    Impaired fasting glucose-no increased thirst or urination. No symptoms consistent with hypoglycemia.  F/U CKD 3  -stable.  No recent changes.   Past Medical History:  Diagnosis Date  . Allergy   . Anxiety   . Arthritis not diagnosed  . Cataract   . CKD (chronic kidney disease) stage 3, GFR 30-59 ml/min   . Heart murmur last visit  . Hx of adenomatous polyp of colon 12/04/2016  . Hypercholesteremia   . Hypertension   . Neuromuscular disorder ([place]) not diagnosed  . Osteoporosis     Past Surgical History:  Procedure Laterality Date  . ABDOMINAL HYSTERECTOMY    . BREAST BIOPSY  2011   left  . COLONOSCOPY      Family History  Problem Relation Age of Onset  . Hypertension Other   . Arthritis Sister   . Cancer Sister   . Early death Daughter   . Colon cancer Neg Hx     Social History   Socioeconomic History  . Marital status: Divorced    Spouse name: Not on file  . Number of children: 1  . Years of education: 17  . Highest education level: Some college, no degree  Occupational History  . Occupation: Retired Corporate treasurer  Tobacco Use  . Smoking status: Never Smoker  . Smokeless tobacco: Never Used  Substance and Sexual Activity  . Alcohol use: No  . Drug use: No  . Sexual activity: Never  Other Topics Concern  . Not on file  Social History Narrative   Exercises regularly.  No caffeine.    Social Determinants of Health   Financial Resource Strain:   . Difficulty of Paying Living Expenses: Not on file  Food Insecurity:   . Worried About Charity fundraiser in the Last  Year: Not on file  . Ran Out of Food in the Last Year: Not on file  Transportation Needs:   . Lack of Transportation (Medical): Not on file  . Lack of Transportation (Non-Medical): Not on file  Physical Activity:   . Days of Exercise per Week: Not on file  . Minutes of Exercise per Session: Not on file  Stress:   . Feeling of Stress : Not on file  Social Connections:   . Frequency of Communication with Friends and Family: Not on file  . Frequency of Social Gatherings with Friends and Family: Not on file  . Attends Religious Services: Not on file  . Active Member of Clubs or Organizations: Not on file  . Attends Archivist Meetings: Not on file  . Marital Status: Not on file  Intimate Partner Violence:   . Fear of Current or Ex-Partner: Not on file  . Emotionally Abused: Not on file  . Physically Abused: Not on file  . Sexually Abused: Not on file    Outpatient Medications Prior to Visit  Medication Sig Dispense Refill  . amLODipine (NORVASC) 10 MG tablet TAKE 1 TABLET (10 MG TOTAL) BY MOUTH DAILY. 90 tablet 1  . calcium carbonate (OS-CAL) 600 MG TABS Take  600 mg by mouth 2 (two) times daily with a meal.    . Multiple Vitamins-Minerals (CENTRUM SILVER PO) Take 1 tablet by mouth once.    . risedronate (ACTONEL) 35 MG tablet TAKE 1 TABLET EVERY 7 DAYS WITH WATER ON AN EMPTY STOMACH, NOTHING BY MOUTH OR LIE DOWN FOR NEXT 30 MINUTES 12 tablet 3  . Vitamin D, Cholecalciferol, 400 UNITS CHEW Chew by mouth.     No facility-administered medications prior to visit.    Allergies  Allergen Reactions  . Alendronate Sodium     REACTION: rash  . Atorvastatin Other (See Comments)    Myalgias.    . Benazepril Cough  . Statins     REACTION: myalgias  . Verapamil Hcl     REACTION: swelling    ROS Review of Systems    Objective:    Physical Exam  Constitutional: She is oriented to person, place, and time. She appears well-developed and well-nourished.  HENT:  Head:  Normocephalic and atraumatic.  Cardiovascular: Normal rate and regular rhythm.  Murmur heard. 2/6 SEM best heard at the right sternal border.   Pulmonary/Chest: Effort normal and breath sounds normal.  Neurological: She is alert and oriented to person, place, and time.  Skin: Skin is warm and dry.  Psychiatric: She has a normal mood and affect. Her behavior is normal.    BP 135/69   Pulse 79   Ht 5\' 1"  (1.549 m)   Wt 141 lb (64 kg)   SpO2 99%   BMI 26.64 kg/m  Wt Readings from Last 3 Encounters:  11/24/19 141 lb (64 kg)  11/23/19 141 lb (64 kg)  05/21/19 132 lb (59.9 kg)     There are no preventive care reminders to display for this patient.  There are no preventive care reminders to display for this patient.  Lab Results  Component Value Date   TSH 1.84 12/01/2018   Lab Results  Component Value Date   WBC 6.3 12/01/2018   HGB 14.9 12/01/2018   HCT 45.1 (H) 12/01/2018   MCV 87.6 12/01/2018   PLT 225 12/01/2018   Lab Results  Component Value Date   NA 142 05/21/2019   K 3.7 05/21/2019   CO2 24 05/21/2019   GLUCOSE 98 05/21/2019   BUN 14 05/21/2019   CREATININE 0.99 (H) 05/21/2019   BILITOT 0.7 12/01/2018   ALKPHOS 73 09/19/2018   AST 24 12/01/2018   ALT 18 12/01/2018   PROT 7.1 12/01/2018   ALBUMIN 4.1 09/19/2018   CALCIUM 10.2 05/21/2019   ANIONGAP 9 09/19/2018   Lab Results  Component Value Date   CHOL 223 (H) 05/21/2019   Lab Results  Component Value Date   HDL 51 05/21/2019   Lab Results  Component Value Date   LDLCALC 138 (H) 05/21/2019   Lab Results  Component Value Date   TRIG 205 (H) 05/21/2019   Lab Results  Component Value Date   CHOLHDL 4.4 05/21/2019   Lab Results  Component Value Date   HGBA1C 5.8 (A) 11/24/2019      Assessment & Plan:   Problem List Items Addressed This Visit      Cardiovascular and Mediastinum   ESSENTIAL HYPERTENSION, BENIGN    Well controlled. Continue current regimen. Follow up in  6 mo. Due for  BMP      Relevant Orders   BASIC METABOLIC PANEL WITH GFR     Endocrine   IMPAIRED FASTING GLUCOSE - Primary    Well  controlled. Continue current regimen. Follow up in  6 mo      Relevant Orders   POCT glycosylated hemoglobin (Hb A1C) (Completed)     Musculoskeletal and Integument   Osteoporosis    Continue alendronate for approximately 1 more year and then okay to discontinue again and take a drug holiday.        Genitourinary   CKD (chronic kidney disease) stage 3, GFR 30-59 ml/min    Due to recheck renal funciton.       Relevant Orders   Urine Microalbumin w/creat. ratio     Other   Systolic murmur    Plan to schedule Echo      Relevant Orders   ECHOCARDIOGRAM COMPLETE      No orders of the defined types were placed in this encounter.   Follow-up: Return in about 6 months (around 05/23/2020) for Hypertensiona and glucose.    Beatrice Lecher, MD

## 2019-11-24 NOTE — Assessment & Plan Note (Signed)
Continue alendronate for approximately 1 more year and then okay to discontinue again and take a drug holiday.

## 2019-11-24 NOTE — Telephone Encounter (Signed)
Please call patient and let her know I did look back.  We originally put the murmur diagnosis on her chart about 5 years ago.  I did not see any up-to-date cardiac imaging so I would like to schedule for echocardiogram.  It is basically just an ultrasound of the heart there is no radiation etc.  It can be done at our [facility] location which is about 8 miles from here.  And it can certainly be done at her convenience no urgency whatsoever.  Test ordered.

## 2019-11-25 LAB — MICROALBUMIN / CREATININE URINE RATIO
Creatinine, Urine: 86 mg/dL (ref 20–275)
Microalb Creat Ratio: 3 mcg/mg creat (ref <30)
Microalb, Ur: 0.3 mg/dL

## 2019-11-25 NOTE — Telephone Encounter (Signed)
Patient advised of recommendations.  

## 2019-11-26 ENCOUNTER — Encounter: Payer: Self-pay | Admitting: Family Medicine

## 2019-11-29 ENCOUNTER — Ambulatory Visit (INDEPENDENT_AMBULATORY_CARE_PROVIDER_SITE_OTHER): Payer: Medicare HMO

## 2019-11-29 ENCOUNTER — Ambulatory Visit (INDEPENDENT_AMBULATORY_CARE_PROVIDER_SITE_OTHER): Payer: Medicare HMO | Admitting: Sports Medicine

## 2019-11-29 ENCOUNTER — Other Ambulatory Visit: Payer: Medicare HMO

## 2019-11-29 ENCOUNTER — Other Ambulatory Visit: Payer: Self-pay

## 2019-11-29 DIAGNOSIS — M19071 Primary osteoarthritis, right ankle and foot: Secondary | ICD-10-CM | POA: Insufficient documentation

## 2019-11-29 DIAGNOSIS — M19072 Primary osteoarthritis, left ankle and foot: Secondary | ICD-10-CM | POA: Insufficient documentation

## 2019-11-29 DIAGNOSIS — G8929 Other chronic pain: Secondary | ICD-10-CM

## 2019-11-29 DIAGNOSIS — M25572 Pain in left ankle and joints of left foot: Secondary | ICD-10-CM

## 2019-11-29 DIAGNOSIS — M25571 Pain in right ankle and joints of right foot: Secondary | ICD-10-CM

## 2019-11-29 DIAGNOSIS — R600 Localized edema: Secondary | ICD-10-CM | POA: Diagnosis not present

## 2019-11-29 MED ORDER — CELECOXIB 200 MG PO CAPS: ORAL_CAPSULE | ORAL | 2 refills | Status: DC

## 2019-11-29 NOTE — Progress Notes (Signed)
    Procedures performed today:    None.  Independent interpretation of tests performed by another provider:   None.  Impression and Recommendations:    Bilateral ankle pain Dessa returns, she is a pleasant 79 year old female with chronic bilateral ankle pain. Pain is localized along the tibiotalar joint, worse with weightbearing and present for the past several months. She has minimal swelling on exam. We are going to proceed with x-rays, Celebrex, home rehab exercises, return to see me in 1 month, bilateral injections if no better. Certainly MRI may be in her future.    ___________________________________________ Gwen Her. Dianah Field, M.D., ABFM., CAQSM. Primary Care and [place] Instructor of [place] of [facility] of Medicine

## 2019-11-29 NOTE — Assessment & Plan Note (Signed)
Linda Werner returns, she is a pleasant 79 year old female with chronic bilateral ankle pain. Pain is localized along the tibiotalar joint, worse with weightbearing and present for the past several months. She has minimal swelling on exam. We are going to proceed with x-rays, Celebrex, home rehab exercises, return to see me in 1 month, bilateral injections if no better. Certainly MRI may be in her future.

## 2019-12-04 ENCOUNTER — Ambulatory Visit: Payer: Medicare HMO | Attending: Internal Medicine

## 2019-12-04 DIAGNOSIS — Z23 Encounter for immunization: Secondary | ICD-10-CM | POA: Insufficient documentation

## 2019-12-04 NOTE — Progress Notes (Signed)
   Covid-19 Vaccination Clinic  Name:  Linda Werner    MRN: [id number] DOB: 05-17-1941  12/04/2019  [name] was observed post Covid-19 immunization for  15 minutes without incidence. She was provided with Vaccine Information Sheet and instruction to access the V-Safe system.   [name] was instructed to call 911 with any severe reactions post vaccine: Marland Kitchen Difficulty breathing  . Swelling of your face and throat  . A fast heartbeat  . A bad rash all over your body  . Dizziness and weakness    Immunizations Administered    Name Date Dose VIS Date Route   Pfizer COVID-19 Vaccine 12/04/2019  8:58 AM 0.3 mL 10/01/2019 Intramuscular   Manufacturer: [place]   Lot: [id number]   [place]: [id number]

## 2019-12-08 ENCOUNTER — Ambulatory Visit (HOSPITAL_BASED_OUTPATIENT_CLINIC_OR_DEPARTMENT_OTHER): Payer: Medicare HMO

## 2019-12-27 ENCOUNTER — Ambulatory Visit (INDEPENDENT_AMBULATORY_CARE_PROVIDER_SITE_OTHER): Payer: Medicare HMO | Admitting: Sports Medicine

## 2019-12-27 ENCOUNTER — Ambulatory Visit: Payer: Medicare HMO | Attending: Internal Medicine

## 2019-12-27 ENCOUNTER — Encounter: Payer: Self-pay | Admitting: Sports Medicine

## 2019-12-27 ENCOUNTER — Other Ambulatory Visit: Payer: Self-pay

## 2019-12-27 DIAGNOSIS — M19071 Primary osteoarthritis, right ankle and foot: Secondary | ICD-10-CM

## 2019-12-27 DIAGNOSIS — M19072 Primary osteoarthritis, left ankle and foot: Secondary | ICD-10-CM

## 2019-12-27 DIAGNOSIS — Z23 Encounter for immunization: Secondary | ICD-10-CM | POA: Insufficient documentation

## 2019-12-27 NOTE — Progress Notes (Signed)
   Covid-19 Vaccination Clinic  Name:  Linda Werner    MRN: [id number] DOB: Nov 03, 1940  12/27/2019  [name] was observed post Covid-19 immunization for 15 minutes without incident. She was provided with Vaccine Information Sheet and instruction to access the V-Safe system.   [name] was instructed to call 911 with any severe reactions post vaccine: Marland Kitchen Difficulty breathing  . Swelling of face and throat  . A fast heartbeat  . A bad rash all over body  . Dizziness and weakness   Immunizations Administered    Name Date Dose VIS Date Route   Pfizer COVID-19 Vaccine 12/27/2019  9:40 AM 0.3 mL 10/01/2019 Intramuscular   Manufacturer: [place]   Lot: [id number]   [place]: [id number]

## 2019-12-27 NOTE — Progress Notes (Signed)
    Procedures performed today:    None.  Independent interpretation of notes and tests performed by another provider:   None.  Impression and Recommendations:    Primary osteoarthritis of both ankles Linda Werner returns, she is a pleasant 79 year old female. X-ray showed bilateral ankle osteoarthritis, we added some anti-inflammatories and she returns today pain-free. She gets occasional bilateral leg and ankle swelling which is likely benign dependent edema, she will elevate her feet when sitting on the couch, and follow-up with her PCP for the edema. Otherwise from an orthopedic standpoint she may come back to see me on an as-needed basis.    ___________________________________________ Gwen Her. Dianah Field, M.D., ABFM., CAQSM. Primary Care and [place] Instructor of [place] of [facility] of Medicine

## 2019-12-27 NOTE — Assessment & Plan Note (Signed)
Linda Werner returns, she is a pleasant 79 year old female. X-ray showed bilateral ankle osteoarthritis, we added some anti-inflammatories and she returns today pain-free. She gets occasional bilateral leg and ankle swelling which is likely benign dependent edema, she will elevate her feet when sitting on the couch, and follow-up with her PCP for the edema. Otherwise from an orthopedic standpoint she may come back to see me on an as-needed basis.

## 2020-03-23 ENCOUNTER — Other Ambulatory Visit: Payer: Self-pay | Admitting: Family Medicine

## 2020-03-23 DIAGNOSIS — I10 Essential (primary) hypertension: Secondary | ICD-10-CM

## 2020-04-10 DIAGNOSIS — M8589 Other specified disorders of bone density and structure, multiple sites: Secondary | ICD-10-CM | POA: Diagnosis not present

## 2020-04-10 LAB — HM DEXA SCAN

## 2020-04-14 ENCOUNTER — Telehealth: Payer: Self-pay | Admitting: Family Medicine

## 2020-04-14 NOTE — Telephone Encounter (Signed)
Call patient: I did receive her bone density test from [facility].  Her T score is -2.1 in the mildly thin category a called osteopenia.  No significant change from prior.  Continue taking Actonel for now.

## 2020-04-17 NOTE — Telephone Encounter (Signed)
Patient advised.

## 2020-04-20 ENCOUNTER — Encounter: Payer: Self-pay | Admitting: Family Medicine

## 2020-06-05 DIAGNOSIS — Z1231 Encounter for screening mammogram for malignant neoplasm of breast: Secondary | ICD-10-CM | POA: Diagnosis not present

## 2020-06-05 LAB — HM MAMMOGRAPHY

## 2020-07-14 ENCOUNTER — Other Ambulatory Visit: Payer: Self-pay | Admitting: *Deleted

## 2020-07-14 MED ORDER — RISEDRONATE SODIUM 35 MG PO TABS: ORAL_TABLET | ORAL | 3 refills | Status: DC

## 2021-01-09 DIAGNOSIS — H5203 Hypermetropia, bilateral: Secondary | ICD-10-CM | POA: Diagnosis not present

## 2021-01-26 DIAGNOSIS — I10 Essential (primary) hypertension: Secondary | ICD-10-CM | POA: Diagnosis not present

## 2021-01-26 DIAGNOSIS — H40013 Open angle with borderline findings, low risk, bilateral: Secondary | ICD-10-CM | POA: Diagnosis not present

## 2021-02-13 ENCOUNTER — Ambulatory Visit (INDEPENDENT_AMBULATORY_CARE_PROVIDER_SITE_OTHER): Payer: Medicare HMO | Admitting: Family Medicine

## 2021-02-13 ENCOUNTER — Other Ambulatory Visit: Payer: Self-pay

## 2021-02-13 ENCOUNTER — Encounter: Payer: Self-pay | Admitting: Family Medicine

## 2021-02-13 VITALS — BP 134/65 | HR 81 | Ht 61.0 in | Wt 144.0 lb

## 2021-02-13 DIAGNOSIS — M810 Age-related osteoporosis without current pathological fracture: Secondary | ICD-10-CM

## 2021-02-13 DIAGNOSIS — R7301 Impaired fasting glucose: Secondary | ICD-10-CM

## 2021-02-13 DIAGNOSIS — I10 Essential (primary) hypertension: Secondary | ICD-10-CM

## 2021-02-13 DIAGNOSIS — N1831 Chronic kidney disease, stage 3a: Secondary | ICD-10-CM | POA: Diagnosis not present

## 2021-02-13 LAB — POCT GLYCOSYLATED HEMOGLOBIN (HGB A1C): Hemoglobin A1C: 5.9 % — AB (ref 4.0–5.6)

## 2021-02-13 NOTE — Assessment & Plan Note (Signed)
A1C looks great today A1C of 5.9 today. Keep up the good work. F/U in 6 months.

## 2021-02-13 NOTE — Progress Notes (Signed)
Pt d/c'd Actonel she didn't see where it made a difference

## 2021-02-13 NOTE — Assessment & Plan Note (Signed)
Following kidney function every 6 months.

## 2021-02-13 NOTE — Assessment & Plan Note (Signed)
Well controlled. Continue current regimen. Follow up in  6 mo  

## 2021-02-13 NOTE — Assessment & Plan Note (Signed)
Discussed drug HOliday this year and then recheck the DEXA next year.

## 2021-02-13 NOTE — Progress Notes (Signed)
Established Patient Office Visit  Subjective:  Patient ID: Linda Werner, female    DOB: January 11, 1941  Age: 80 y.o. MRN: [id number]  CC:  Chief Complaint  Patient presents with  . Follow-up    HPI Linda Werner presents for   Hypertension- Pt denies chest pain, SOB, dizziness, or heart palpitations.  Taking meds as directed w/o problems.  Denies medication side effects.    Impaired fasting glucose-no increased thirst or urination. No symptoms consistent with hypoglycemia.  she has been having some tightness in her hamstrings.  Also been experiencing a little bit more low back pain.  But she tries to stay active she has been exercising.  She still tries to mow her yard.  Past Medical History:  Diagnosis Date  . Allergy   . Anxiety   . Arthritis not diagnosed  . Cataract   . CKD (chronic kidney disease) stage 3, GFR 30-59 ml/min (HCC)   . Heart murmur last visit  . Hx of adenomatous polyp of colon 12/04/2016  . Hypercholesteremia   . Hypertension   . Neuromuscular disorder ([place]) not diagnosed  . Osteoporosis     Past Surgical History:  Procedure Laterality Date  . ABDOMINAL HYSTERECTOMY    . BREAST BIOPSY  2011   left  . COLONOSCOPY      Family History  Problem Relation Age of Onset  . Hypertension Other   . Arthritis Sister   . Cancer Sister   . Early death Daughter   . Colon cancer Neg Hx     Social History   Socioeconomic History  . Marital status: Divorced    Spouse name: Not on file  . Number of children: 1  . Years of education: 72  . Highest education level: Some college, no degree  Occupational History  . Occupation: Retired Corporate treasurer  Tobacco Use  . Smoking status: Never Smoker  . Smokeless tobacco: Never Used  Vaping Use  . Vaping Use: Never used  Substance and Sexual Activity  . Alcohol use: No  . Drug use: No  . Sexual activity: Never  Other Topics Concern  . Not on file  Social History Narrative   Exercises regularly.  No  caffeine.    Social Determinants of Health   Financial Resource Strain: Not on file  Food Insecurity: Not on file  Transportation Needs: Not on file  Physical Activity: Not on file  Stress: Not on file  Social Connections: Not on file  Intimate Partner Violence: Not on file    Outpatient Medications Prior to Visit  Medication Sig Dispense Refill  . amLODipine (NORVASC) 10 MG tablet TAKE 1 TABLET EVERY DAY 90 tablet 1  . calcium carbonate (OS-CAL) 600 MG TABS Take 600 mg by mouth 2 (two) times daily with a meal.    . celecoxib (CELEBREX) 200 MG capsule One to 2 tablets by mouth daily as needed for pain. 60 capsule 2  . Multiple Vitamins-Minerals (CENTRUM SILVER PO) Take 1 tablet by mouth once.    . Vitamin D, Cholecalciferol, 400 UNITS CHEW Chew by mouth.    . risedronate (ACTONEL) 35 MG tablet TAKE 1 TABLET EVERY 7 DAYS WITH WATER ON AN EMPTY STOMACH, NOTHING BY MOUTH OR LIE DOWN FOR NEXT 30 MINUTES 12 tablet 3   No facility-administered medications prior to visit.    Allergies  Allergen Reactions  . Alendronate Sodium     REACTION: rash  . Atorvastatin Other (See Comments)    Myalgias.    Marland Kitchen  Benazepril Cough  . Statins     REACTION: myalgias  . Verapamil Hcl     REACTION: swelling    ROS Review of Systems    Objective:    Physical Exam Constitutional:      Appearance: She is well-developed.  HENT:     Head: Normocephalic and atraumatic.  Cardiovascular:     Rate and Rhythm: Normal rate and regular rhythm.     Heart sounds: Normal heart sounds.  Pulmonary:     Effort: Pulmonary effort is normal.     Breath sounds: Normal breath sounds.  Skin:    General: Skin is warm and dry.  Neurological:     Mental Status: She is alert and oriented to person, place, and time.  Psychiatric:        Behavior: Behavior normal.     BP 134/65   Pulse 81   Ht 5\' 1"  (1.549 m)   Wt 144 lb (65.3 kg)   SpO2 97%   BMI 27.21 kg/m  Wt Readings from Last 3 Encounters:   02/13/21 144 lb (65.3 kg)  11/24/19 141 lb (64 kg)  11/23/19 141 lb (64 kg)     Health Maintenance Due  Topic Date Due  . Hepatitis C Screening  Never done  . COVID-19 Vaccine (3 - Booster for Pfizer series) 06/28/2020    There are no preventive care reminders to display for this patient.  Lab Results  Component Value Date   TSH 1.84 12/01/2018   Lab Results  Component Value Date   WBC 6.3 12/01/2018   HGB 14.9 12/01/2018   HCT 45.1 (H) 12/01/2018   MCV 87.6 12/01/2018   PLT 225 12/01/2018   Lab Results  Component Value Date   NA 141 11/24/2019   K 3.7 11/24/2019   CO2 29 11/24/2019   GLUCOSE 103 (H) 11/24/2019   BUN 13 11/24/2019   CREATININE 0.97 (H) 11/24/2019   BILITOT 0.7 12/01/2018   ALKPHOS 73 09/19/2018   AST 24 12/01/2018   ALT 18 12/01/2018   PROT 7.1 12/01/2018   ALBUMIN 4.1 09/19/2018   CALCIUM 10.1 11/24/2019   ANIONGAP 9 09/19/2018   Lab Results  Component Value Date   CHOL 223 (H) 05/21/2019   Lab Results  Component Value Date   HDL 51 05/21/2019   Lab Results  Component Value Date   LDLCALC 138 (H) 05/21/2019   Lab Results  Component Value Date   TRIG 205 (H) 05/21/2019   Lab Results  Component Value Date   CHOLHDL 4.4 05/21/2019   Lab Results  Component Value Date   HGBA1C 5.9 (A) 02/13/2021      Assessment & Plan:   Problem List Items Addressed This Visit      Cardiovascular and Mediastinum   ESSENTIAL HYPERTENSION, BENIGN    Well controlled. Continue current regimen. Follow up in  6 mo       Relevant Orders   CBC   COMPLETE METABOLIC PANEL WITH GFR   Lipid panel     Endocrine   IMPAIRED FASTING GLUCOSE - Primary    A1C looks great today A1C of 5.9 today. Keep up the good work. F/U in 6 months.        Relevant Orders   POCT glycosylated hemoglobin (Hb A1C) (Completed)   CBC   COMPLETE METABOLIC PANEL WITH GFR   Lipid panel     Musculoskeletal and Integument   Osteoporosis    Discussed drug HOliday this  year and  then recheck the DEXA next year.          Genitourinary   CKD stage G3a/A1, GFR 45-59 and albumin creatinine ratio <30 mg/g (HCC)    Following kidney function every 6 months.        Relevant Orders   CBC   COMPLETE METABOLIC PANEL WITH GFR   Lipid panel   Urine Microalbumin w/creat. ratio     Tightness in her hamstrings - work on stretches.    No orders of the defined types were placed in this encounter.   Follow-up: Return in about 6 months (around 08/15/2021) for labs and check BP and A1C .    Beatrice Lecher, MD

## 2021-02-15 DIAGNOSIS — R7301 Impaired fasting glucose: Secondary | ICD-10-CM | POA: Diagnosis not present

## 2021-02-15 DIAGNOSIS — I10 Essential (primary) hypertension: Secondary | ICD-10-CM | POA: Diagnosis not present

## 2021-02-15 DIAGNOSIS — N1831 Chronic kidney disease, stage 3a: Secondary | ICD-10-CM | POA: Diagnosis not present

## 2021-02-15 LAB — CBC: MCHC: 32.7 g/dL (ref 32.0–36.0)

## 2021-02-16 LAB — COMPLETE METABOLIC PANEL WITH GFR
AG Ratio: 1.8 (calc) (ref 1.0–2.5)
ALT: 23 U/L (ref 6–29)
AST: 27 U/L (ref 10–35)
Albumin: 4.5 g/dL (ref 3.6–5.1)
Alkaline phosphatase (APISO): 85 U/L (ref 37–153)
BUN/Creatinine Ratio: 9 (calc) (ref 6–22)
BUN: 9 mg/dL (ref 7–25)
CO2: 27 mmol/L (ref 20–32)
Calcium: 9.5 mg/dL (ref 8.6–10.4)
Chloride: 108 mmol/L (ref 98–110)
Creat: 0.95 mg/dL — ABNORMAL HIGH (ref 0.60–0.93)
GFR, Est African American: 66 mL/min/{1.73_m2} (ref >=60)
GFR, Est Non African American: 57 mL/min/{1.73_m2} — ABNORMAL LOW (ref >=60)
Globulin: 2.5 g/dL (calc) (ref 1.9–3.7)
Glucose, Bld: 106 mg/dL — ABNORMAL HIGH (ref 65–99)
Potassium: 3.4 mmol/L — ABNORMAL LOW (ref 3.5–5.3)
Sodium: 144 mmol/L (ref 135–146)
Total Bilirubin: 0.6 mg/dL (ref 0.2–1.2)
Total Protein: 7 g/dL (ref 6.1–8.1)

## 2021-02-16 LAB — LIPID PANEL
Cholesterol: 233 mg/dL — ABNORMAL HIGH (ref <200)
HDL: 51 mg/dL (ref >=50)
LDL Cholesterol (Calc): 147 mg/dL (calc) — ABNORMAL HIGH
Non-HDL Cholesterol (Calc): 182 mg/dL (calc) — ABNORMAL HIGH (ref <130)
Total CHOL/HDL Ratio: 4.6 (calc) (ref <5.0)
Triglycerides: 208 mg/dL — ABNORMAL HIGH (ref <150)

## 2021-02-16 LAB — CBC
HCT: 44.4 % (ref 35.0–45.0)
Hemoglobin: 14.5 g/dL (ref 11.7–15.5)
MCH: 28.7 pg (ref 27.0–33.0)
MCV: 87.7 fL (ref 80.0–100.0)
MPV: 11.1 fL (ref 7.5–12.5)
Platelets: 197 10*3/uL (ref 140–400)
RBC: 5.06 10*6/uL (ref 3.80–5.10)
RDW: 12.9 % (ref 11.0–15.0)
WBC: 6.2 10*3/uL (ref 3.8–10.8)

## 2021-02-16 LAB — MICROALBUMIN / CREATININE URINE RATIO
Creatinine, Urine: 207 mg/dL (ref 20–275)
Microalb Creat Ratio: 7 mcg/mg creat (ref <30)
Microalb, Ur: 1.4 mg/dL

## 2021-03-29 ENCOUNTER — Other Ambulatory Visit: Payer: Self-pay | Admitting: Family Medicine

## 2021-03-29 DIAGNOSIS — I10 Essential (primary) hypertension: Secondary | ICD-10-CM

## 2021-06-19 DIAGNOSIS — Z1231 Encounter for screening mammogram for malignant neoplasm of breast: Secondary | ICD-10-CM | POA: Diagnosis not present

## 2021-06-19 LAB — HM MAMMOGRAPHY

## 2021-07-04 ENCOUNTER — Encounter: Payer: Self-pay | Admitting: Family Medicine

## 2021-07-12 DIAGNOSIS — H2511 Age-related nuclear cataract, right eye: Secondary | ICD-10-CM | POA: Diagnosis not present

## 2021-07-13 DIAGNOSIS — H2512 Age-related nuclear cataract, left eye: Secondary | ICD-10-CM | POA: Diagnosis not present

## 2021-07-19 ENCOUNTER — Other Ambulatory Visit: Payer: Self-pay | Admitting: *Deleted

## 2021-07-19 DIAGNOSIS — I10 Essential (primary) hypertension: Secondary | ICD-10-CM

## 2021-07-19 MED ORDER — AMLODIPINE BESYLATE 10 MG PO TABS: 10.0000 mg | ORAL_TABLET | Freq: Every day | ORAL | 1 refills | Status: DC

## 2021-07-26 DIAGNOSIS — H2512 Age-related nuclear cataract, left eye: Secondary | ICD-10-CM | POA: Diagnosis not present

## 2021-07-28 DIAGNOSIS — I10 Essential (primary) hypertension: Secondary | ICD-10-CM | POA: Diagnosis not present

## 2021-07-28 DIAGNOSIS — Z789 Other specified health status: Secondary | ICD-10-CM | POA: Diagnosis not present

## 2021-07-28 DIAGNOSIS — E785 Hyperlipidemia, unspecified: Secondary | ICD-10-CM | POA: Diagnosis not present

## 2021-08-01 DIAGNOSIS — Z0000 Encounter for general adult medical examination without abnormal findings: Secondary | ICD-10-CM | POA: Diagnosis not present

## 2021-08-01 DIAGNOSIS — E785 Hyperlipidemia, unspecified: Secondary | ICD-10-CM | POA: Diagnosis not present

## 2021-08-08 IMAGING — DX DG ANKLE COMPLETE 3+V*L*
3 series · 3 of 3 positions shown · non-contrast
Comparison: 07/06/2018

CLINICAL DATA: Bilateral ankle pain

EXAM:
LEFT ANKLE COMPLETE - 3+ VIEW

[ankle ap]
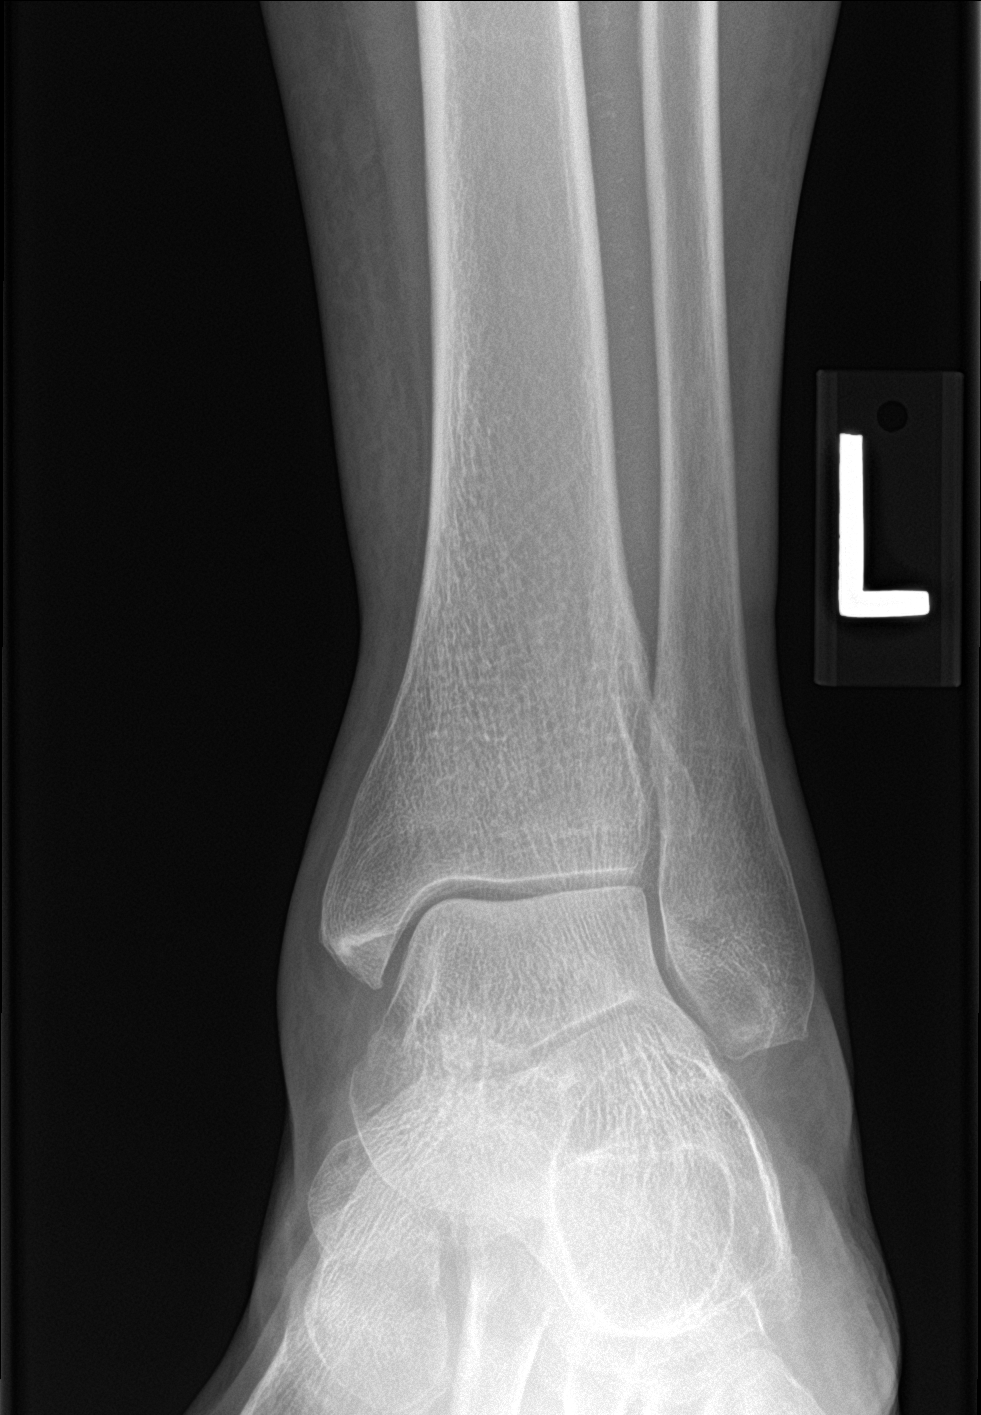

[ankle obl]
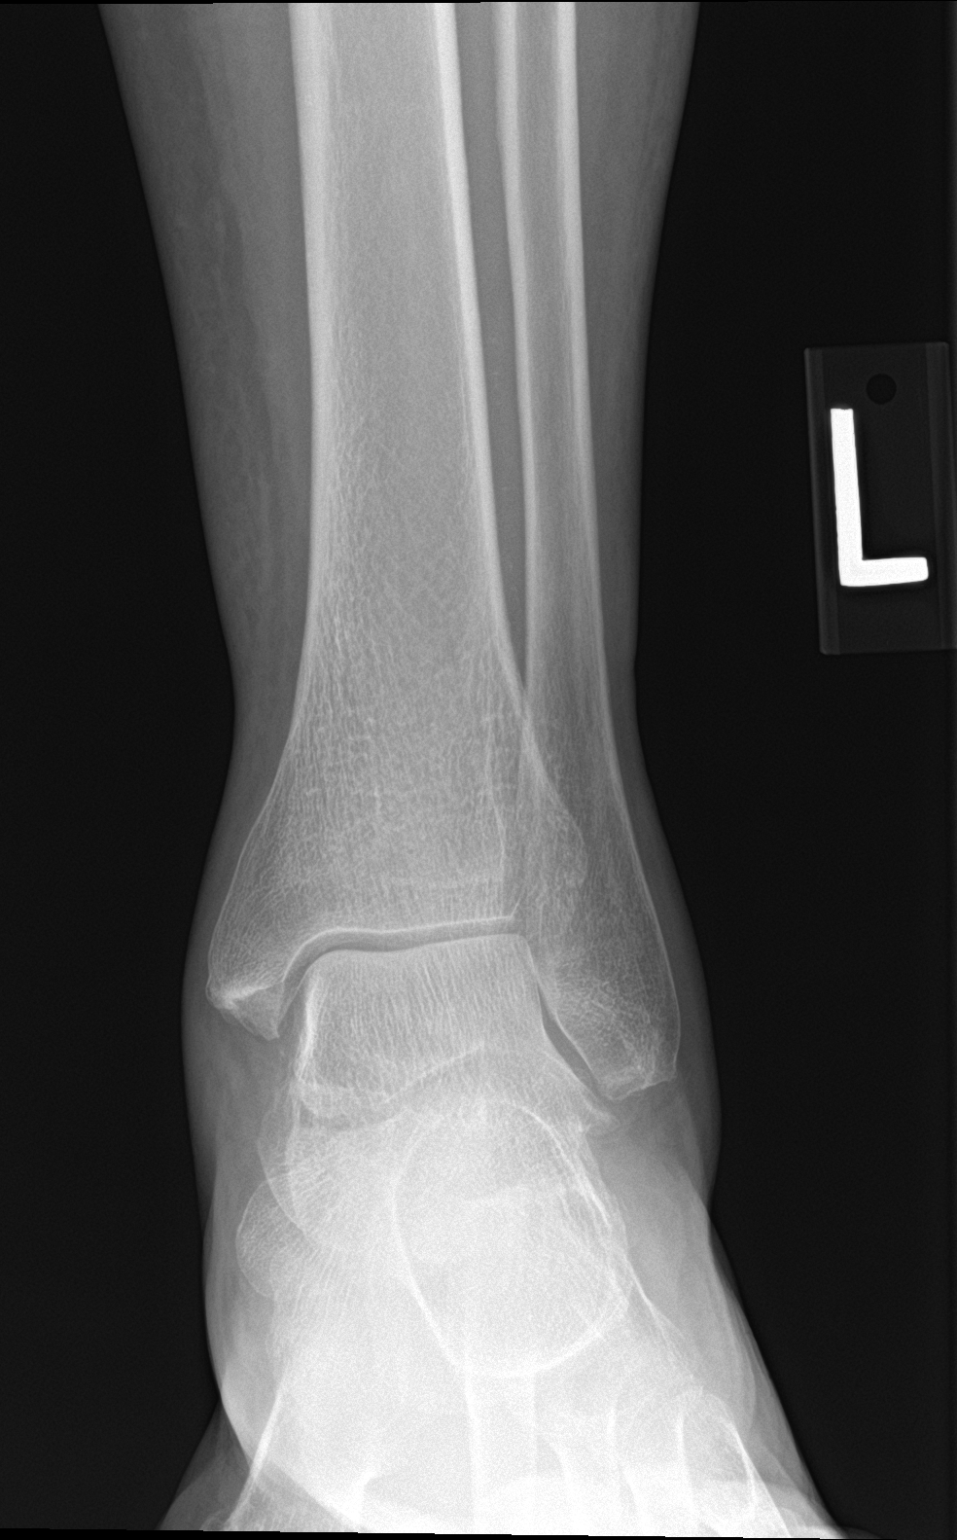

[ankle lat]
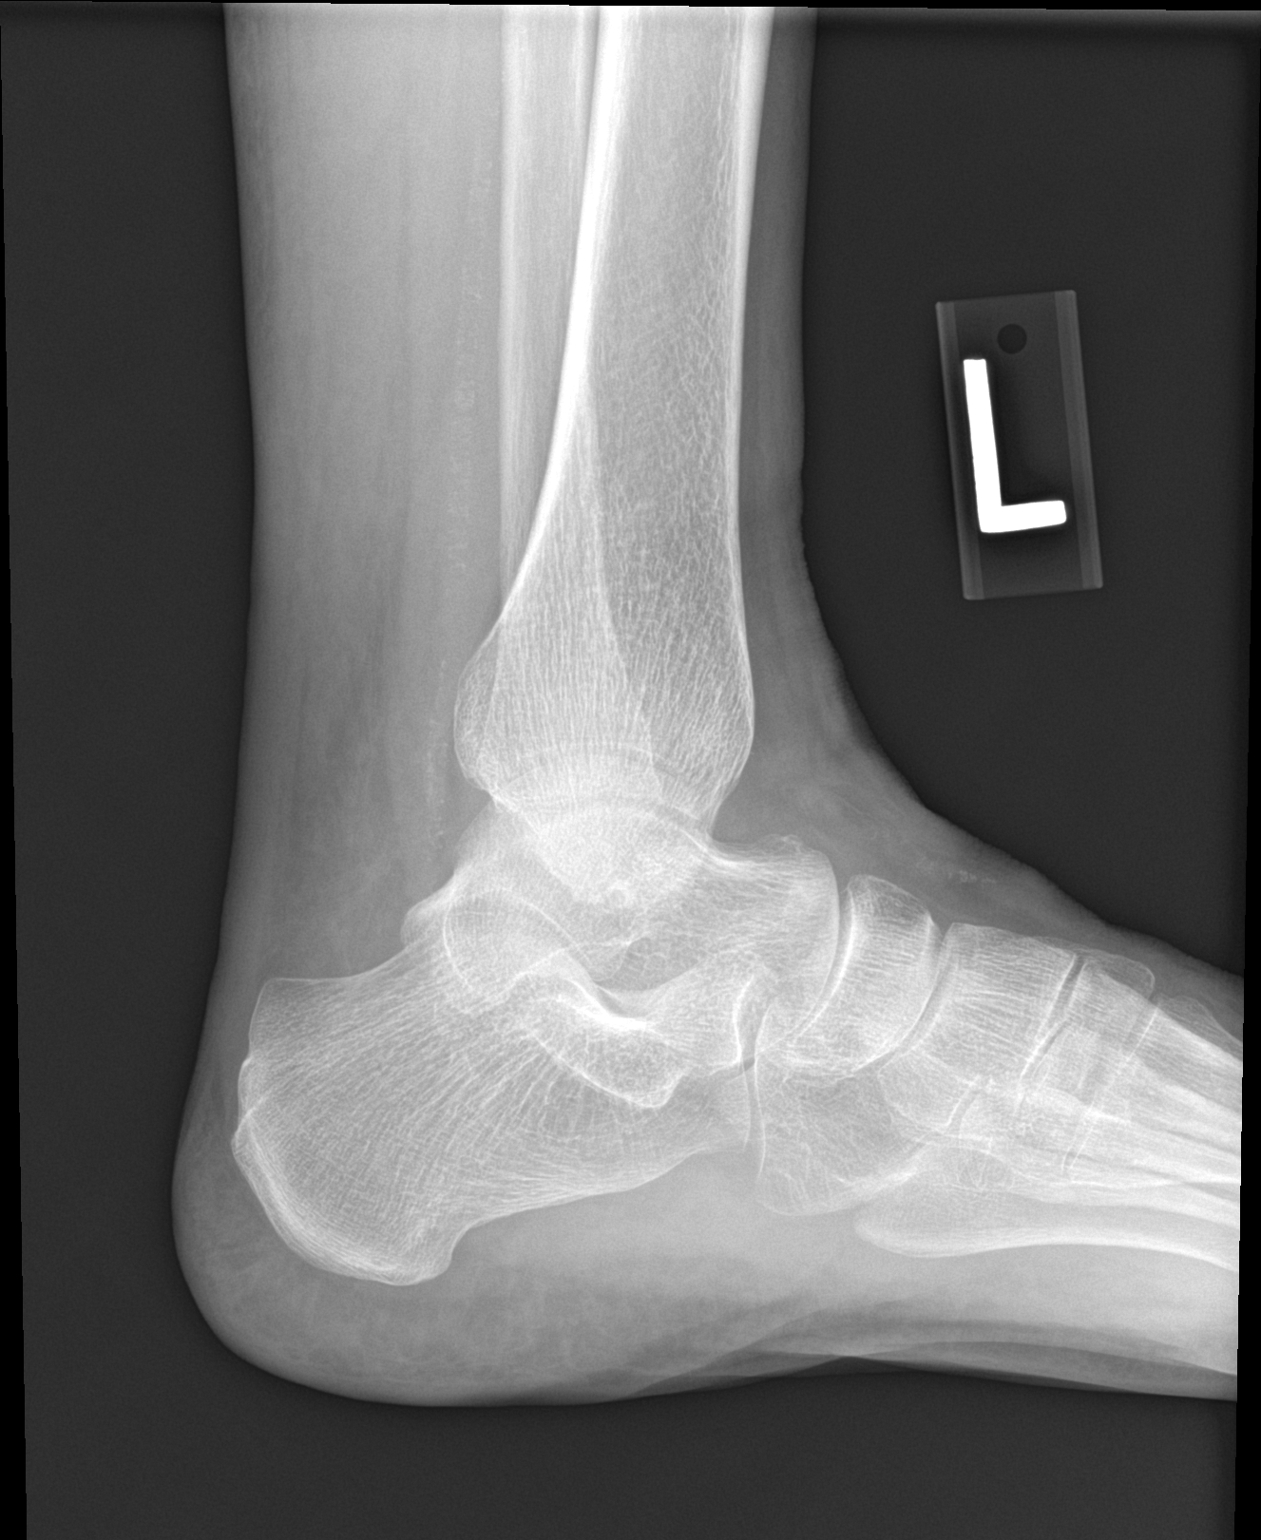

[3 of 3 positions shown; findings below may reference images not displayed]

FINDINGS: Frontal, oblique, lateral views of the left ankle are obtained. No
acute displaced fracture, subluxation, or dislocation. Joint spaces
are well preserved. Soft tissues are normal.
IMPRESSION: 1. Stable left ankle, no acute process.

## 2021-08-16 ENCOUNTER — Ambulatory Visit (INDEPENDENT_AMBULATORY_CARE_PROVIDER_SITE_OTHER): Payer: Medicare Other | Admitting: Family Medicine

## 2021-08-16 ENCOUNTER — Encounter: Payer: Self-pay | Admitting: Family Medicine

## 2021-08-16 VITALS — BP 124/70 | HR 85 | Ht 61.0 in | Wt 135.0 lb

## 2021-08-16 DIAGNOSIS — R7301 Impaired fasting glucose: Secondary | ICD-10-CM

## 2021-08-16 DIAGNOSIS — Z23 Encounter for immunization: Secondary | ICD-10-CM | POA: Diagnosis not present

## 2021-08-16 DIAGNOSIS — I10 Essential (primary) hypertension: Secondary | ICD-10-CM

## 2021-08-16 DIAGNOSIS — N1831 Chronic kidney disease, stage 3a: Secondary | ICD-10-CM

## 2021-08-16 DIAGNOSIS — E782 Mixed hyperlipidemia: Secondary | ICD-10-CM | POA: Diagnosis not present

## 2021-08-16 LAB — POCT GLYCOSYLATED HEMOGLOBIN (HGB A1C): Hemoglobin A1C: 5.9 % — AB (ref 4.0–5.6)

## 2021-08-16 MED ORDER — DOCUSATE SODIUM 100 MG PO CAPS: 100.0000 mg | ORAL_CAPSULE | Freq: Two times a day (BID) | ORAL | 1 refills | Status: DC

## 2021-08-16 NOTE — Assessment & Plan Note (Signed)
Well controlled. Continue current regimen. Follow up in  6 mo  

## 2021-08-16 NOTE — Progress Notes (Signed)
Established Patient Office Visit  Subjective:  Patient ID: Linda Werner, female    DOB: June 20, 1941  Age: 80 y.o. MRN: [id number]  CC:  Chief Complaint  Patient presents with   ifg   Hypertension    HPI Linda Werner presents for   Hypertension- Pt denies chest pain, SOB, dizziness, or heart palpitations.  Taking meds as directed w/o problems.  Denies medication side effects.    Impaired fasting glucose-no increased thirst or urination. No symptoms consistent with hypoglycemia.  F?u CKD 3-no changes to urination.  No blood in the urine etc.  No recent changes.  She is not currently taking any NSAIDs or Tylenol products.  He has had bilateral cataract surgery since I last saw her she is done really well.  When I is now 2020 Was 20/30 she really has not even had to use any readers at this point.  Did want to discuss Colace and whether or not it would be safe for her to use frequently to help keep her bowels moving regularly.  She says when she is tried things like eating oatmeal she feels like it actually does the opposite like its binding.    Past Medical History:  Diagnosis Date   Allergy    Anxiety    Arthritis not diagnosed   Cataract    CKD (chronic kidney disease) stage 3, GFR 30-59 ml/min (HCC)    Heart murmur last visit   Hx of adenomatous polyp of colon 12/04/2016   Hypercholesteremia    Hypertension    Neuromuscular disorder (HCC) not diagnosed   Osteoporosis     Past Surgical History:  Procedure Laterality Date   ABDOMINAL HYSTERECTOMY     BREAST BIOPSY  2011   left   COLONOSCOPY      Family History  Problem Relation Age of Onset   Hypertension Other    Arthritis Sister    Cancer Sister    Early death Daughter    Colon cancer Neg Hx     Social History   Socioeconomic History   Marital status: Divorced    Spouse name: Not on file   Number of children: 1   Years of education: 15   Highest education level: Some college, no degree   Occupational History   Occupation: Retired Corporate treasurer  Tobacco Use   Smoking status: Never   Smokeless tobacco: Never  Vaping Use   Vaping Use: Never used  Substance and Sexual Activity   Alcohol use: No   Drug use: No   Sexual activity: Never  Other Topics Concern   Not on file  Social History Narrative   Exercises regularly.  No caffeine.    Social Determinants of Health   Financial Resource Strain: Not on file  Food Insecurity: Not on file  Transportation Needs: Not on file  Physical Activity: Not on file  Stress: Not on file  Social Connections: Not on file  Intimate Partner Violence: Not on file    Outpatient Medications Prior to Visit  Medication Sig Dispense Refill   amLODipine (NORVASC) 10 MG tablet Take 1 tablet (10 mg total) by mouth daily. 90 tablet 1   calcium carbonate (OS-CAL) 600 MG TABS Take 600 mg by mouth 2 (two) times daily with a meal.     ketorolac (ACULAR) 0.5 % ophthalmic solution Place 1 drop into the left eye 4 (four) times daily.  1   Multiple Vitamins-Minerals (CENTRUM SILVER PO) Take 1 tablet by mouth once.  Vitamin D, Cholecalciferol, 400 UNITS CHEW Chew by mouth.     celecoxib (CELEBREX) 200 MG capsule One to 2 tablets by mouth daily as needed for pain. 60 capsule 2   No facility-administered medications prior to visit.    Allergies  Allergen Reactions   Alendronate Sodium     REACTION: rash   Atorvastatin Other (See Comments)    Myalgias.     Benazepril Cough   Statins     REACTION: myalgias   Verapamil Hcl     REACTION: swelling    ROS Review of Systems    Objective:    Physical Exam Constitutional:      Appearance: Normal appearance. She is well-developed.  HENT:     Head: Normocephalic and atraumatic.  Neck:     Vascular: No carotid bruit.  Cardiovascular:     Rate and Rhythm: Normal rate and regular rhythm.     Heart sounds: Normal heart sounds.  Pulmonary:     Effort: Pulmonary effort is normal.     Breath sounds:  Normal breath sounds.  Skin:    General: Skin is warm and dry.  Neurological:     Mental Status: She is alert and oriented to person, place, and time.  Psychiatric:        Behavior: Behavior normal.    BP 124/70   Pulse 85   Ht 5\' 1"  (1.549 m)   Wt 135 lb (61.2 kg)   SpO2 98%   BMI 25.51 kg/m  Wt Readings from Last 3 Encounters:  08/16/21 135 lb (61.2 kg)  02/13/21 144 lb (65.3 kg)  11/24/19 141 lb (64 kg)     Health Maintenance Due  Topic Date Due   Hepatitis C Screening  Never done   Zoster Vaccines- Shingrix (1 of 2) Never done   COVID-19 Vaccine (3 - Booster for Pfizer series) 02/21/2020    There are no preventive care reminders to display for this patient.  Lab Results  Component Value Date   TSH 1.84 12/01/2018   Lab Results  Component Value Date   WBC 6.2 02/15/2021   HGB 14.5 02/15/2021   HCT 44.4 02/15/2021   MCV 87.7 02/15/2021   PLT 197 02/15/2021   Lab Results  Component Value Date   NA 144 02/15/2021   K 3.4 (L) 02/15/2021   CO2 27 02/15/2021   GLUCOSE 106 (H) 02/15/2021   BUN 9 02/15/2021   CREATININE 0.95 (H) 02/15/2021   BILITOT 0.6 02/15/2021   ALKPHOS 73 09/19/2018   AST 27 02/15/2021   ALT 23 02/15/2021   PROT 7.0 02/15/2021   ALBUMIN 4.1 09/19/2018   CALCIUM 9.5 02/15/2021   ANIONGAP 9 09/19/2018   Lab Results  Component Value Date   CHOL 233 (H) 02/15/2021   Lab Results  Component Value Date   HDL 51 02/15/2021   Lab Results  Component Value Date   LDLCALC 147 (H) 02/15/2021   Lab Results  Component Value Date   TRIG 208 (H) 02/15/2021   Lab Results  Component Value Date   CHOLHDL 4.6 02/15/2021   Lab Results  Component Value Date   HGBA1C 5.9 (A) 08/16/2021      Assessment & Plan:   Problem List Items Addressed This Visit       Cardiovascular and Mediastinum   ESSENTIAL HYPERTENSION, BENIGN - Primary    Well controlled. Continue current regimen. Follow up in  6 mo         Endocrine  IMPAIRED  FASTING GLUCOSE    A1c looks right steady today at 5.9.  Continue to current regimen with diet and exercise.      Relevant Orders   POCT glycosylated hemoglobin (Hb A1C) (Completed)     Genitourinary   CKD stage G3a/A1, GFR 45-59 and albumin creatinine ratio <30 mg/g (HCC)    No function in a couple weeks ago was 1.0.  Previous 0.9.  Stable.  We will continue to monitor every 6 months.  Continue to avoid NSAIDs.        Other   Hyperlipemia    He recently had her lipids checked and did bring [place] a copy today.  Her LDL cholesterol was 139 which is a little bit better than last year actually.  We will get those abstracted into her chart.      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  We did discuss this is a good option and did not keep her bowels moving regularly.  She would like it sent as a prescription to the pharmacy.  Meds ordered this encounter  Medications   docusate sodium (COLACE) 100 MG capsule    Sig: Take 1 capsule (100 mg total) by mouth 2 (two) times daily.    Dispense:  180 capsule    Refill:  1     Follow-up: Return in about 6 months (around 02/14/2022) for Hypertension and A1C  .    Beatrice Lecher, MD

## 2021-08-16 NOTE — Assessment & Plan Note (Signed)
No function in a couple weeks ago was 1.0.  Previous 0.9.  Stable.  We will continue to monitor every 6 months.  Continue to avoid NSAIDs.

## 2021-08-16 NOTE — Assessment & Plan Note (Signed)
He recently had her lipids checked and did bring [place] a copy today.  Her LDL cholesterol was 139 which is a little bit better than last year actually.  We will get those abstracted into her chart.

## 2021-08-16 NOTE — Assessment & Plan Note (Signed)
A1c looks right steady today at 5.9.  Continue to current regimen with diet and exercise.

## 2022-02-11 ENCOUNTER — Telehealth: Payer: Self-pay | Admitting: *Deleted

## 2022-02-11 NOTE — Telephone Encounter (Signed)
Ok for acute slot for tomorrow.  ?

## 2022-02-11 NOTE — Telephone Encounter (Signed)
Pt called and stated that she began to experience an episode of Diverticulitis on yesterday. She has an appointment on 02/14/2022 with Dr. Madilyn Fireman and wanted to know if Dr. Madilyn Fireman would send over something for her to take so that she didn't have to make 2 trips to different doctors.  ? ?Pt advised that I would send her request to Dr. Madilyn Fireman for advice.  ? ?Pt's pharmacy confirmed.   ?

## 2022-02-11 NOTE — Telephone Encounter (Signed)
Called and scheduled appt for pt

## 2022-02-12 ENCOUNTER — Encounter: Payer: Self-pay | Admitting: Family Medicine

## 2022-02-12 ENCOUNTER — Other Ambulatory Visit: Payer: Self-pay | Admitting: Family Medicine

## 2022-02-12 ENCOUNTER — Ambulatory Visit (INDEPENDENT_AMBULATORY_CARE_PROVIDER_SITE_OTHER): Payer: Medicare Other | Admitting: Family Medicine

## 2022-02-12 VITALS — BP 135/63 | HR 72 | Resp 16 | Ht 61.0 in | Wt 127.0 lb

## 2022-02-12 DIAGNOSIS — I10 Essential (primary) hypertension: Secondary | ICD-10-CM | POA: Diagnosis not present

## 2022-02-12 DIAGNOSIS — A059 Bacterial foodborne intoxication, unspecified: Secondary | ICD-10-CM | POA: Diagnosis not present

## 2022-02-12 DIAGNOSIS — R7301 Impaired fasting glucose: Secondary | ICD-10-CM

## 2022-02-12 DIAGNOSIS — N1831 Chronic kidney disease, stage 3a: Secondary | ICD-10-CM

## 2022-02-12 DIAGNOSIS — E782 Mixed hyperlipidemia: Secondary | ICD-10-CM

## 2022-02-12 LAB — CBC
HCT: 44.7 % (ref 35.0–45.0)
Hemoglobin: 14.4 g/dL (ref 11.7–15.5)
MCH: 28.7 pg (ref 27.0–33.0)
MCHC: 32.2 g/dL (ref 32.0–36.0)
MCV: 89.2 fL (ref 80.0–100.0)
MPV: 11.7 fL (ref 7.5–12.5)
Platelets: 192 10*3/uL (ref 140–400)
RBC: 5.01 10*6/uL (ref 3.80–5.10)
RDW: 13.7 % (ref 11.0–15.0)
WBC: 7.4 10*3/uL (ref 3.8–10.8)

## 2022-02-12 LAB — BASIC METABOLIC PANEL
BUN: 12 mg/dL (ref 7–25)
CO2: 26 mmol/L (ref 20–32)
Calcium: 9.6 mg/dL (ref 8.6–10.4)
Chloride: 106 mmol/L (ref 98–110)
Creat: 0.99 mg/dL (ref 0.60–1.00)
Glucose, Bld: 106 mg/dL — ABNORMAL HIGH (ref 65–99)
Potassium: 3.5 mmol/L (ref 3.5–5.3)
Sodium: 141 mmol/L (ref 135–146)

## 2022-02-12 LAB — LIPID PANEL W/REFLEX DIRECT LDL
Cholesterol: 217 mg/dL — ABNORMAL HIGH (ref <200)
HDL: 50 mg/dL (ref >=50)
LDL Cholesterol (Calc): 139 mg/dL (calc) — ABNORMAL HIGH
Non-HDL Cholesterol (Calc): 167 mg/dL (calc) — ABNORMAL HIGH (ref <130)
Total CHOL/HDL Ratio: 4.3 (calc) (ref <5.0)
Triglycerides: 153 mg/dL — ABNORMAL HIGH (ref <150)

## 2022-02-12 LAB — POCT GLYCOSYLATED HEMOGLOBIN (HGB A1C): Hemoglobin A1C: 5.6 % (ref 4.0–5.6)

## 2022-02-12 NOTE — Assessment & Plan Note (Signed)
A1C is fantastic today at 5.6!!! Great work   ?

## 2022-02-12 NOTE — Progress Notes (Signed)
? ?Acute Office Visit ? ?Subjective:  ? ?  ?Patient ID: Linda Werner, female    DOB: March 28, 1941, 81 y.o.   MRN: [id number] ? ?Chief Complaint  ?Patient presents with  ? abdominal pain  ?  Patient states she had abdominal pain, vomiting and some blood in stool after eating Salmon on Sunday. Patient stated symptoms have resolved.   ? Impaired Fasting Glucose   ?  Follow up   ? ? ?HPI ?Patient is in today for Patient states she had abdominal pain, vomiting and some blood in stool after eating Salmon on Sunday. Patient stated symptoms have resolved.  She said she thought maybe she had seen blood because the stool was a pink-orange color and she had never seen anything like that before.  She is feeling a lot better today. ? ?Impaired fasting glucose-no increased thirst or urination. No symptoms consistent with hypoglycemia.  She reports that she really has purposely been making some healthy choices she has become vegetarian and she is trying to lose weight to really get her blood sugars and cholesterol under better control. ? ? ?ROS ? ? ?   ?Objective:  ?  ?BP 135/63   Pulse 72   Resp 16   Ht '5\' 1"'$  (1.549 m)   Wt 127 lb (57.6 kg)   SpO2 95%   BMI 24.00 kg/m?  ? ? ?Physical Exam ?Vitals and nursing note reviewed.  ?Constitutional:   ?   Appearance: She is well-developed.  ?HENT:  ?   Head: Normocephalic and atraumatic.  ?Cardiovascular:  ?   Rate and Rhythm: Normal rate and regular rhythm.  ?   Heart sounds: Normal heart sounds.  ?Pulmonary:  ?   Effort: Pulmonary effort is normal.  ?   Breath sounds: Normal breath sounds.  ?Skin: ?   General: Skin is warm and dry.  ?Neurological:  ?   Mental Status: She is alert and oriented to person, place, and time.  ?Psychiatric:     ?   Behavior: Behavior normal.  ? ? ?Results for orders placed or performed in visit on 02/12/22  ?POCT HgB A1C  ?Result Value Ref Range  ? Hemoglobin A1C 5.6 4.0 - 5.6 %  ? HbA1c POC (<> result, manual entry)    ? HbA1c, POC (prediabetic  range)    ? HbA1c, POC (controlled diabetic range)    ? ? ? ?   ?Assessment & Plan:  ? ?Problem List Items Addressed This Visit   ? ?  ? Cardiovascular and Mediastinum  ? ESSENTIAL HYPERTENSION, BENIGN - Primary  ?  Well controlled. Continue current regimen. Follow up in  6 mo ? ?  ?  ? Relevant Orders  ? Basic Metabolic Panel (BMET)  ?  ? Endocrine  ? IMPAIRED FASTING GLUCOSE  ?  A1C is fantastic today at 5.6!!! Great work   ? ?  ?  ? Relevant Orders  ? POCT HgB A1C (Completed)  ? CBC  ?  ? Genitourinary  ? CKD stage G3a/A1, GFR 45-59 and albumin creatinine ratio <30 mg/g (HCC)  ? Relevant Orders  ? CBC  ?  ? Other  ? Hyperlipemia  ? Relevant Orders  ? Lipid Panel w/reflex Direct LDL  ? ?Other Visit Diagnoses   ? ? Food poisoning      ? ?  ? ? ?Food poisoning-she actually seems to be much better it turns out that her grandson who cooked it also had symptoms. ? ?No orders of  the defined types were placed in this encounter. ? ? ?Return in about 6 months (around 08/14/2022) for Hypertension and IFG . ? ?Beatrice Lecher, MD ? ? ?

## 2022-02-12 NOTE — Assessment & Plan Note (Signed)
Well controlled. Continue current regimen. Follow up in  6 mo  

## 2022-02-13 NOTE — Progress Notes (Signed)
Hi Linda Werner, your LDL cholesterol is up a little bit at 139 but it actually looks better than it did 12 months ago when it was 147 so you got it back down to where it was 2 years ago which is great progress.  Your potassium is normal and kidney function is stable.  Blood count is normal no sign of anemia.

## 2022-02-14 ENCOUNTER — Ambulatory Visit: Payer: Medicare Other | Admitting: Family Medicine

## 2022-06-11 ENCOUNTER — Ambulatory Visit: Payer: Self-pay | Admitting: Licensed Clinical Social Worker

## 2022-06-11 NOTE — Patient Instructions (Addendum)
Visit Information  Thank you for taking time to visit with me today. Please don't hesitate to contact me if I can be of assistance to you before our next scheduled telephone appointment.  Following are the goals we discussed today:   No further intervention needed at present  Please call the care guide team at [phone] if you need to cancel or reschedule your appointment.   If you are experiencing a Mental Health or [place] or need someone to talk to, please go to [facility] [place] [phone])   Following is a copy of your full plan of care:   Care Coordination Interventions:  Active listening / Reflection utilized  Discussed Care Coordination program support Discussed care needs of client Client said she had some difficulty with balance issues.  Client was appreciative to have information about program support and said she would keep program in mind if she should need this support in the future   Ms. Parrott was given information about Care Management services by the embedded care coordination team including:  Care Management services include personalized support from designated clinical staff supervised by her physician, including individualized plan of care and coordination with other care providers 24/7 contact phone numbers for assistance for urgent and routine care needs. The patient may stop CCM services at any time (effective at the end of the month) by phone call to the office staff.  Patient agreed to services and verbal consent obtained.   Norva Riffle.[place] MSW, [place] [facility] representative [facility] Care Management [phone]

## 2022-06-11 NOTE — Patient Outreach (Signed)
  Care Coordination   Initial Visit Note   06/11/2022 Name: Linda Werner MRN: [id number] DOB: 11/04/1940  Linda Werner is a 81 y.o. year old female who sees Metheney, Rene Kocher, MD for primary care. I spoke with  Linda Werner by phone today  What matters to the patients health and wellness today?  Client appreciative of program information. Feels that she is stable now; but, she will keep program in mind if she should need program support in the future    Goals Addressed             This Visit's Progress    Patient Stated she was glad to hear of program support. she said she would keep program in mind if she should need this support in the future       Care Coordination Interventions:  Active listening / Reflection utilized  Discussed Care Coordination program support Discussed care needs of client Client said she had some difficulty with balance issues.  Client was appreciative to have information about program support and said she would keep program in mind if she should need this support in the future     SDOH assessments and interventions completed:  No   Care Coordination Interventions Activated:  No  Care Coordination Interventions:  No, not indicated   Follow up plan: No further intervention required.   Encounter Outcome:  Pt. Visit Completed

## 2022-06-12 ENCOUNTER — Encounter: Payer: Self-pay | Admitting: General Practice

## 2022-07-02 DIAGNOSIS — M8589 Other specified disorders of bone density and structure, multiple sites: Secondary | ICD-10-CM | POA: Diagnosis not present

## 2022-07-02 DIAGNOSIS — Z1231 Encounter for screening mammogram for malignant neoplasm of breast: Secondary | ICD-10-CM | POA: Diagnosis not present

## 2022-07-02 DIAGNOSIS — Z780 Asymptomatic menopausal state: Secondary | ICD-10-CM | POA: Diagnosis not present

## 2022-07-02 DIAGNOSIS — M810 Age-related osteoporosis without current pathological fracture: Secondary | ICD-10-CM | POA: Diagnosis not present

## 2022-07-02 LAB — HM MAMMOGRAPHY

## 2022-07-02 LAB — HM DEXA SCAN

## 2022-07-03 ENCOUNTER — Telehealth: Payer: Self-pay | Admitting: Family Medicine

## 2022-07-03 DIAGNOSIS — M810 Age-related osteoporosis without current pathological fracture: Secondary | ICD-10-CM

## 2022-07-03 NOTE — Telephone Encounter (Signed)
Please call patient and let her know that I did receive her bone density test from [facility] mammography.  She has had a decrease in her bone density of the lumbar spine and right hip compared to prior exam.  T score was -2.6 this puts her into the osteoporosis category.  Would recommend starting a bisphosphonate such as Fosamax or Boniva.   The current recommendation for osteoporosis treatment includes:   #1 calcium-total of 1200 mg of calcium daily.  If you eat a very calcium rich diet you may be able to obtain that without a supplement.  If not, then I recommend calcium 500 mg twice a day.  There are several products over-the-counter such as Caltrate D and Viactiv chews which are great options that contain calcium and vitamin D. #2 vitamin D-recommend 800 international units daily. #3 exercise-recommend 30 minutes of weightbearing exercise 3 days a week.  Resistance training ,such as doing bands and light weights, can be particularly helpful. #4 medication-if you are not currently on a bone builder, also called a bisphosphonate, then this has been shown to be very helpful in maintaining bone strength, preventing further thinning of the bones, and reducing your risk for fractures.  I would highly recommend that you consider starting 1 of these medications.  If you are okay with that then please let us know and we will send one to your pharmacy.  If you would like to discuss further we are happy to make an appointment for you so that we can go over options for treatment.

## 2022-07-05 NOTE — Telephone Encounter (Signed)
LVM for pt to call to discuss results.  T. Chalsey Leeth, CMA  

## 2022-07-06 ENCOUNTER — Encounter: Payer: Self-pay | Admitting: Family Medicine

## 2022-07-08 ENCOUNTER — Ambulatory Visit
Admission: RE | Admit: 2022-07-08 | Discharge: 2022-07-08 | Disposition: A | Discharge status: Home / Self Care | Payer: Medicare Other | Source: Ambulatory Visit | Attending: Family Medicine | Admitting: Family Medicine

## 2022-07-08 ENCOUNTER — Ambulatory Visit (INDEPENDENT_AMBULATORY_CARE_PROVIDER_SITE_OTHER): Payer: Medicare Other

## 2022-07-08 VITALS — BP 130/77 | HR 87 | Temp 97.9°F | Resp 18 | Ht 61.0 in | Wt 130.0 lb

## 2022-07-08 DIAGNOSIS — M7989 Other specified soft tissue disorders: Secondary | ICD-10-CM | POA: Diagnosis not present

## 2022-07-08 DIAGNOSIS — M25571 Pain in right ankle and joints of right foot: Secondary | ICD-10-CM | POA: Diagnosis not present

## 2022-07-08 DIAGNOSIS — M25471 Effusion, right ankle: Secondary | ICD-10-CM

## 2022-07-08 NOTE — Discharge Instructions (Addendum)
Advised patient of right ankle x-ray with hard copy provided to patient.  Advised patient may RICE right ankle for 25 to 30 minutes 2-3 times daily, as needed for soft tissue swelling.  Advised patient if symptoms worsen and/or unresolved please follow-up with  orthopedics/sports medicine physician for further evaluation.  Contact information provided below.  Advised patient if symptoms worsen and/or unresolved please follow-up with PCP or here for further evaluation.

## 2022-07-08 NOTE — ED Triage Notes (Signed)
Patient c/o right ankle swelling x 3 weeks.  No apparent injury.  Patient does have a history of osteoarthritis.  Patient concerned for possible blood clot.  Denies pain.  The swelling does subsided when her leg is elevated and resting.

## 2022-07-08 NOTE — Telephone Encounter (Signed)
Pls mail letter

## 2022-07-08 NOTE — ED Provider Notes (Signed)
[name]    CSN: [id number] Arrival date & time: 07/08/22  1045      History   Chief Complaint Chief Complaint  Patient presents with   Ankle Pain    Entered by patient   Appointment right ankle swelling    HPI Linda Werner is a 81 y.o. female.   HPI 81 year old female presents with right ankle swelling for 3 weeks.  No apparent injury.  Patient does have history is osteoarthritis.  Patient is concerned with possible blood clot.  Swelling does subside when her leg is elevated and resting.  PMH significant for CKD stage III, cataract and arthritis of both ankles.  Past Medical History:  Diagnosis Date   Allergy    Anxiety    Arthritis not diagnosed   Cataract    CKD (chronic kidney disease) stage 3, GFR 30-59 ml/min (HCC)    Heart murmur last visit   Hx of adenomatous polyp of colon 12/04/2016   Hypercholesteremia    Hypertension    Neuromuscular disorder ([place]) not diagnosed   Osteoporosis     Patient Active Problem List   Diagnosis Date Noted   Primary osteoarthritis of both ankles 11/29/2019   Hx of adenomatous polyp of colon 80/12/4915   Systolic murmur 91/50/5697   Cataract, senile 09/02/2012   CKD stage G3a/A1, GFR 45-59 and albumin creatinine ratio <30 mg/g ([place]) 03/22/2009   VITILIGO 03/22/2009   IMPAIRED FASTING GLUCOSE 03/22/2009   Hyperlipemia 09/01/2008   ESSENTIAL HYPERTENSION, BENIGN 09/01/2008   Osteoporosis 09/01/2008    Past Surgical History:  Procedure Laterality Date   ABDOMINAL HYSTERECTOMY     BREAST BIOPSY  2011   left   COLONOSCOPY      OB History   No obstetric history on file.      Home Medications    Prior to Admission medications   Medication Sig Start Date End Date Taking? Authorizing Provider  amLODipine (NORVASC) 10 MG tablet TAKE 1 TABLET BY MOUTH EVERY DAY 02/13/22  Yes Hali Marry, MD  calcium carbonate (OS-CAL) 600 MG TABS Take 600 mg by mouth 2 (two) times daily with a meal.   Yes  [provider]  Multiple Vitamins-Minerals (CENTRUM SILVER PO) Take 1 tablet by mouth once.   Yes [provider]  Vitamin D, Cholecalciferol, 400 UNITS CHEW Chew by mouth.   Yes [provider]    Family History Family History  Problem Relation Age of Onset   Arthritis Sister    Cancer Sister    Early death Daughter    Hypertension Other    Colon cancer Neg Hx     Social History Social History   Tobacco Use   Smoking status: Never   Smokeless tobacco: Never  Vaping Use   Vaping Use: Never used  Substance Use Topics   Alcohol use: No   Drug use: No     Allergies   Alendronate sodium, Atorvastatin, Benazepril, Statins, and Verapamil hcl   Review of Systems Review of Systems  Musculoskeletal:        Right ankle pain and swelling x3 weeks  All other systems reviewed and are negative.    Physical Exam Triage Vital Signs ED Triage Vitals  Enc Vitals Group     BP 07/08/22 1105 130/77     Pulse Rate 07/08/22 1105 87     Resp 07/08/22 1105 18     Temp 07/08/22 1105 97.9 F (36.6 C)     Temp Source  07/08/22 1105 Oral     SpO2 07/08/22 1105 96 %     Weight 07/08/22 1106 130 lb (59 kg)     Height 07/08/22 1106 '5\' 1"'$  (1.549 m)     Head Circumference --      Peak Flow --      Pain Score 07/08/22 1106 0     Pain Loc --      Pain Edu? --      Excl. in [place]? --    No data found.  Updated Vital Signs BP 130/77 (BP Location: Left Arm)   Pulse 87   Temp 97.9 F (36.6 C) (Oral)   Resp 18   Ht '5\' 1"'$  (1.549 m)   Wt 130 lb (59 kg)   SpO2 96%   BMI 24.56 kg/m      Physical Exam Vitals and nursing note reviewed.  Constitutional:      Appearance: Normal appearance. She is normal weight.  HENT:     Head: Normocephalic and atraumatic.     Mouth/Throat:     Mouth: Mucous membranes are moist.     Pharynx: Oropharynx is clear.  Eyes:     Extraocular Movements: Extraocular movements intact.     Conjunctiva/sclera: Conjunctivae normal.      Pupils: Pupils are equal, round, and reactive to light.  Cardiovascular:     Rate and Rhythm: Normal rate and regular rhythm.     Pulses: Normal pulses.     Heart sounds: Normal heart sounds. No murmur heard. Pulmonary:     Effort: Pulmonary effort is normal.     Breath sounds: Normal breath sounds. No wheezing, rhonchi or rales.  Musculoskeletal:        General: Normal range of motion.     Cervical back: Normal range of motion and neck supple.     Comments: Left ankle/left foot: TTP over lateral malleolus with moderate soft tissue swelling noted  Skin:    General: Skin is warm and dry.  Neurological:     General: No focal deficit present.     Mental Status: She is alert and oriented to person, place, and time.      UC Treatments / Results  Labs (all labs ordered are listed, but only abnormal results are displayed) Labs Reviewed - No data to display  EKG   Radiology DG Ankle Complete Right  Result Date: 07/08/2022 CLINICAL DATA:  Lateral ankle pain since injury in July EXAM: RIGHT ANKLE - COMPLETE 3+ VIEW COMPARISON:  11/29/2019 FINDINGS: Nonspecific soft tissue swelling. No evidence of fracture or dislocation. No degenerative change. Arterial calcification incidentally noted. IMPRESSION: Nonspecific soft tissue swelling. Electronically Signed   By: Nelson Chimes M.D.   On: 07/08/2022 12:46    Procedures Procedures (including critical care time)  Medications Ordered in UC Medications - No data to display  Initial Impression / Assessment and Plan / UC Course  I have reviewed the triage vital signs and the nursing notes.  Pertinent labs & imaging results that were available during my care of the patient were reviewed by me and considered in my medical decision making (see chart for details).     MDM: 1.  Pain and swelling of right ankle-right ankle x-ray revealed above. Advised patient of right ankle x-ray with hard copy provided to patient.  Advised patient may RICE  right ankle for 25 to 30 minutes 2-3 times daily, as needed for soft tissue swelling.  Advised patient if symptoms worsen and/or unresolved please follow-up  with [place] orthopedics/sports medicine physician for further evaluation.  Contact information provided below. Advised patient if symptoms worsen and/or unresolved please follow-up with PCP or here for further evaluation.  Patient discharged home, hemodynamically stable. Final Clinical Impressions(s) / UC Diagnoses   Final diagnoses:  Pain and swelling of right ankle     Discharge Instructions      Advised patient of right ankle x-ray with hard copy provided to patient.  Advised patient may RICE right ankle for 25 to 30 minutes 2-3 times daily, as needed for soft tissue swelling.  Advised patient if symptoms worsen and/or unresolved please follow-up with [place] orthopedics/sports medicine physician for further evaluation.  Contact information provided below.  Advised patient if symptoms worsen and/or unresolved please follow-up with PCP or here for further evaluation.     ED Prescriptions   None    PDMP not reviewed this encounter.   Eliezer Lofts, [place] 07/08/22 1351

## 2022-07-08 NOTE — Telephone Encounter (Signed)
Attempted call to patient. Left voice mail requesting a call back.

## 2022-07-16 ENCOUNTER — Encounter: Payer: Self-pay | Admitting: Family Medicine

## 2022-07-19 ENCOUNTER — Encounter: Payer: Self-pay | Admitting: Family Medicine

## 2022-07-23 NOTE — Telephone Encounter (Signed)
Letter sent via mychart and also printed and mailed to pt.

## 2022-07-23 NOTE — Telephone Encounter (Signed)
Was letter sent? Thank you

## 2022-07-29 MED ORDER — RISEDRONATE SODIUM 35 MG PO TABS: 35.0000 mg | ORAL_TABLET | ORAL | 3 refills | Status: DC

## 2022-08-19 ENCOUNTER — Ambulatory Visit: Payer: Medicare Other | Admitting: Family Medicine

## 2022-09-06 ENCOUNTER — Other Ambulatory Visit: Payer: Self-pay | Admitting: Family Medicine

## 2022-09-06 ENCOUNTER — Telehealth: Payer: Self-pay | Admitting: Family Medicine

## 2022-09-06 DIAGNOSIS — I10 Essential (primary) hypertension: Secondary | ICD-10-CM

## 2022-09-06 NOTE — Telephone Encounter (Signed)
Called patient to schedule AWVS but mailbox is full on 11/17 -MAJ

## 2022-10-11 ENCOUNTER — Telehealth: Payer: Self-pay | Admitting: Family Medicine

## 2022-10-11 DIAGNOSIS — I10 Essential (primary) hypertension: Secondary | ICD-10-CM

## 2022-10-15 MED ORDER — AMLODIPINE BESYLATE 10 MG PO TABS: 10.0000 mg | ORAL_TABLET | Freq: Every day | ORAL | 1 refills | Status: DC

## 2022-10-15 NOTE — Telephone Encounter (Signed)
Please call pt she will need to come in for OV for BP and medication refills. TY

## 2022-10-15 NOTE — Telephone Encounter (Signed)
Meds ordered this encounter  Medications   DISCONTD: amLODipine (NORVASC) 10 MG tablet    Sig: Take 1 tablet (10 mg total) by mouth daily. OFFICE VISIT REQUIRED FOR REFILLS. PLEASE CALL OFFICE TO SCHEDULE.    Dispense:  30 tablet    Refill:  0   amLODipine (NORVASC) 10 MG tablet    Sig: Take 1 tablet (10 mg total) by mouth daily. OFFICE VISIT REQUIRED FOR REFILLS. PLEASE CALL OFFICE TO SCHEDULE.    Dispense:  90 tablet    Refill:  1

## 2022-10-15 NOTE — Telephone Encounter (Signed)
Patient is scheduled for 10/24/22 for BP and medication refill. Patient states that she is out of her amLODipine, and would like to know if she can get a refill until she will be seen on 10/24/22, please advise, thanks.

## 2022-10-24 ENCOUNTER — Ambulatory Visit (INDEPENDENT_AMBULATORY_CARE_PROVIDER_SITE_OTHER): Payer: Medicare Other | Admitting: Family Medicine

## 2022-10-24 ENCOUNTER — Encounter: Payer: Self-pay | Admitting: Family Medicine

## 2022-10-24 VITALS — BP 130/57 | HR 70 | Ht 61.0 in | Wt 136.0 lb

## 2022-10-24 DIAGNOSIS — L918 Other hypertrophic disorders of the skin: Secondary | ICD-10-CM

## 2022-10-24 DIAGNOSIS — I10 Essential (primary) hypertension: Secondary | ICD-10-CM

## 2022-10-24 DIAGNOSIS — N1831 Chronic kidney disease, stage 3a: Secondary | ICD-10-CM | POA: Diagnosis not present

## 2022-10-24 DIAGNOSIS — R7301 Impaired fasting glucose: Secondary | ICD-10-CM

## 2022-10-24 DIAGNOSIS — Z23 Encounter for immunization: Secondary | ICD-10-CM | POA: Diagnosis not present

## 2022-10-24 LAB — POCT GLYCOSYLATED HEMOGLOBIN (HGB A1C): Hemoglobin A1C: 6 % — AB (ref 4.0–5.6)

## 2022-10-24 MED ORDER — AMLODIPINE BESYLATE 10 MG PO TABS: 10.0000 mg | ORAL_TABLET | Freq: Every day | ORAL | 1 refills | Status: DC

## 2022-10-24 NOTE — Assessment & Plan Note (Signed)
Well controlled. Continue current regimen. Follow up in  6 mo  

## 2022-10-24 NOTE — Assessment & Plan Note (Signed)
1C up just a little bit from prior but still in a reasonable range continue to work on healthy food choices and regular exercise.  Follow-up in 6 months.

## 2022-10-24 NOTE — Assessment & Plan Note (Signed)
  Continue to follow renal sinew to follow renal function every 6 months.

## 2022-10-24 NOTE — Progress Notes (Signed)
Established Patient Office Visit  Subjective   Patient ID: Linda Werner, female    DOB: 1941-06-22  Age: 82 y.o. MRN: [id number]  Chief Complaint  Patient presents with   Hypertension   ifg    HPI  Hypertension- Pt denies chest pain, SOB, dizziness, or heart palpitations.  Taking meds as directed w/o problems.  Denies medication side effects.  He would like her prescriptions to be written for 90 days.  It looks like we did send the amlodipine for 90 but the pharmacy only filled 30.    Impaired fasting glucose-no increased thirst or urination. No symptoms consistent with hypoglycemia.  She has put a little bit of weight back on she lost a few pounds last time I saw her.  But she wanted to gain a little bit of weight back.  Also has a skin tag on her right antecubital fossa that she would like removed.      ROS    Objective:     BP (!) 130/57   Pulse 70   Ht '5\' 1"'$  (1.549 m)   Wt 136 lb (61.7 kg)   SpO2 97%   BMI 25.70 kg/m     Physical Exam Vitals and nursing note reviewed.  Constitutional:      Appearance: She is well-developed.  HENT:     Head: Normocephalic and atraumatic.  Cardiovascular:     Rate and Rhythm: Normal rate and regular rhythm.     Heart sounds: Normal heart sounds.  Pulmonary:     Effort: Pulmonary effort is normal.     Breath sounds: Normal breath sounds.  Skin:    General: Skin is warm and dry.  Neurological:     Mental Status: She is alert and oriented to person, place, and time.  Psychiatric:        Behavior: Behavior normal.      Results for orders placed or performed in visit on 10/24/22  POCT glycosylated hemoglobin (Hb A1C)  Result Value Ref Range   Hemoglobin A1C 6.0 (A) 4.0 - 5.6 %   HbA1c POC (<> result, manual entry)     HbA1c, POC (prediabetic range)     HbA1c, POC (controlled diabetic range)         The 10-year ASCVD risk score (Arnett DK, et al., 2019) is: 18.9%    Assessment & Plan:   Problem List  Items Addressed This Visit       Cardiovascular and Mediastinum   Essential hypertension, benign    Well controlled. Continue current regimen. Follow up in  6 mo       Relevant Medications   amLODipine (NORVASC) 10 MG tablet   Other Relevant Orders   COMPLETE METABOLIC PANEL WITH GFR     Endocrine   Impaired fasting glucose - Primary    1C up just a little bit from prior but still in a reasonable range continue to work on healthy food choices and regular exercise.  Follow-up in 6 months.      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      Relevant Orders   COMPLETE METABOLIC PANEL WITH GFR   Urine Microalbumin w/creat. ratio   Other Visit Diagnoses  Need for immunization against influenza       Relevant Orders   Flu Vaccine QUAD High Dose(Fluad) (Completed)   Skin tag          Skin Tag Removal Procedure Note Diagnosis: inflamed skin tags Location: right antecubitral fossa Informed Consent: Discussed risks (permanent scarring, infection, pain, bleeding, bruising, redness, and recurrence of the lesion) and benefits of the procedure, as well as the alternatives. She is aware that skin tags are benign lesions, and their removal is often not considered medically necessary. Informed consent was obtained. Preparation: The area was prepared in a standard fashion. Anesthesia: Lidocaine 1% with epinephrine Procedure Details: Iris scissors were used to perform sharp removal. Aluminum chloride was applied for hemostasis. Ointment and bandage were applied where needed. The patient tolerated the procedure well. Total number of lesions treated: 1 Plan: The patient was instructed on post-op care. Recommend OTC analgesia as needed for pain.  Return in  about 6 months (around 04/24/2023) for Hypertension.    Beatrice Lecher, MD

## 2022-11-22 DIAGNOSIS — R7301 Impaired fasting glucose: Secondary | ICD-10-CM | POA: Diagnosis not present

## 2022-11-22 DIAGNOSIS — N1831 Chronic kidney disease, stage 3a: Secondary | ICD-10-CM | POA: Diagnosis not present

## 2022-11-22 DIAGNOSIS — I10 Essential (primary) hypertension: Secondary | ICD-10-CM | POA: Diagnosis not present

## 2022-11-23 LAB — COMPLETE METABOLIC PANEL WITH GFR
AG Ratio: 1.6 (calc) (ref 1.0–2.5)
ALT: 22 U/L (ref 6–29)
AST: 27 U/L (ref 10–35)
Albumin: 4.3 g/dL (ref 3.6–5.1)
Alkaline phosphatase (APISO): 81 U/L (ref 37–153)
BUN/Creatinine Ratio: 14 (calc) (ref 6–22)
BUN: 15 mg/dL (ref 7–25)
CO2: 25 mmol/L (ref 20–32)
Calcium: 9.4 mg/dL (ref 8.6–10.4)
Chloride: 107 mmol/L (ref 98–110)
Creat: 1.08 mg/dL — ABNORMAL HIGH (ref 0.60–1.00)
Globulin: 2.7 g/dL (calc) (ref 1.9–3.7)
Glucose, Bld: 102 mg/dL — ABNORMAL HIGH (ref 65–99)
Potassium: 3.8 mmol/L (ref 3.5–5.3)
Sodium: 142 mmol/L (ref 135–146)
Total Bilirubin: 0.5 mg/dL (ref 0.2–1.2)
Total Protein: 7 g/dL (ref 6.1–8.1)
eGFR: 52 mL/min/{1.73_m2} — ABNORMAL LOW (ref >=60)

## 2022-11-23 LAB — MICROALBUMIN / CREATININE URINE RATIO
Creatinine, Urine: 247 mg/dL (ref 20–275)
Microalb Creat Ratio: 6 mcg/mg creat (ref <30)
Microalb, Ur: 1.4 mg/dL

## 2022-11-26 NOTE — Progress Notes (Signed)
Your lab work is within acceptable range and there are no concerning findings.   ?

## 2022-12-16 ENCOUNTER — Telehealth: Payer: Self-pay | Admitting: Family Medicine

## 2022-12-16 NOTE — Telephone Encounter (Signed)
Called patient to schedule Medicare Annual Wellness Visit (AWV). No voicemail available to leave a message.  Last date of AWV: 11/23/19  Please schedule an appointment at any time with Nurse Health Advisor.  If any questions, please contact me at [phone].  Thank you ,  Lin Givens Patient Access Advocate II Direct Dial: [phone]

## 2022-12-24 ENCOUNTER — Ambulatory Visit (INDEPENDENT_AMBULATORY_CARE_PROVIDER_SITE_OTHER): Payer: Medicare Other | Admitting: Family Medicine

## 2022-12-24 VITALS — BP 133/67 | HR 90 | Ht 61.0 in

## 2022-12-24 DIAGNOSIS — I10 Essential (primary) hypertension: Secondary | ICD-10-CM

## 2022-12-24 NOTE — Progress Notes (Signed)
Pressure at goal based on age.  Continue current regimen.  Keep routine follow-up.

## 2022-12-24 NOTE — Progress Notes (Signed)
   Established Patient Office Visit  Subjective   Patient ID: Linda Werner, female    DOB: 06/27/1941  Age: 82 y.o. MRN: [id number]  Chief Complaint  Patient presents with   Hypertension    BP check - nurse visit - [facility]    HPI  Hypertension- BP check nurse visit - [facility]-  patient denies chest pain, shortness of breath, dizziness, palpitations or problems with medication.  ROS    Objective:     BP 133/65 (BP Location: Left Arm, Patient Position: Sitting, Cuff Size: Normal)   Pulse 90   Ht '5\' 1"'$  (1.549 m)   SpO2 96%   BMI 25.70 kg/m    Physical Exam   No results found for any visits on 12/24/22.    The ASCVD Risk score (Arnett DK, et al., 2019) failed to calculate for the following reasons:   The 2019 ASCVD risk score is only valid for ages 40 to 61    Assessment & Plan:  BP check - nurse visit - [facility].  Initial reading  133/65.  Patient rested for 5 minutes. Repeat BP 133/67.  Results reviewed by Dr. Suzi Roots and found to be within normal limits for patient age.  Problem List Items Addressed This Visit   None   No follow-ups on file.    Rae Lips, LPN

## 2023-04-28 ENCOUNTER — Encounter: Payer: Self-pay | Admitting: Family Medicine

## 2023-04-28 ENCOUNTER — Ambulatory Visit (INDEPENDENT_AMBULATORY_CARE_PROVIDER_SITE_OTHER): Payer: Medicare Other | Admitting: Family Medicine

## 2023-04-28 VITALS — BP 118/60 | HR 75 | Ht 61.0 in | Wt 135.0 lb

## 2023-04-28 DIAGNOSIS — M5136 Other intervertebral disc degeneration, lumbar region: Secondary | ICD-10-CM | POA: Insufficient documentation

## 2023-04-28 DIAGNOSIS — N1831 Chronic kidney disease, stage 3a: Secondary | ICD-10-CM

## 2023-04-28 DIAGNOSIS — E782 Mixed hyperlipidemia: Secondary | ICD-10-CM | POA: Diagnosis not present

## 2023-04-28 DIAGNOSIS — R7301 Impaired fasting glucose: Secondary | ICD-10-CM

## 2023-04-28 DIAGNOSIS — M810 Age-related osteoporosis without current pathological fracture: Secondary | ICD-10-CM | POA: Diagnosis not present

## 2023-04-28 DIAGNOSIS — I10 Essential (primary) hypertension: Secondary | ICD-10-CM

## 2023-04-28 LAB — POCT GLYCOSYLATED HEMOGLOBIN (HGB A1C): Hemoglobin A1C: 5.7 % — AB (ref 4.0–5.6)

## 2023-04-28 NOTE — Assessment & Plan Note (Signed)
To recheck lipid panel.  She is not currently on a statin.  Not having any cardiovascular symptoms.

## 2023-04-28 NOTE — Assessment & Plan Note (Signed)
At goal based on her age.

## 2023-04-28 NOTE — Assessment & Plan Note (Addendum)
OK to place referral for either chiropractic care or formal physical therapy.  She will let me know.

## 2023-04-28 NOTE — Assessment & Plan Note (Signed)
Continue Actonel

## 2023-04-28 NOTE — Progress Notes (Signed)
Established Patient Office Visit  Subjective   Patient ID: Linda Werner, female    DOB: 1941/07/26  Age: 82 y.o. MRN: [id number]  Chief Complaint  Patient presents with   Hypertension   ifg    HPI  Hypertension- Pt denies chest pain, SOB, dizziness, or heart palpitations.  Taking meds as directed w/o problems.  Denies medication side effects.    Impaired fasting glucose-no increased thirst or urination. No symptoms consistent with hypoglycemia.  Still struggling with some intermittently with her low back she has a known degenerative disc.  She is thinking about doing chiropractic care or PT but wants to look into it a little bit further.    ROS    Objective:     BP 132/68   Pulse 75   Ht 5\' 1"  (1.549 m)   Wt 135 lb (61.2 kg)   SpO2 97%   BMI 25.51 kg/m    Physical Exam Vitals and nursing note reviewed.  Constitutional:      Appearance: She is well-developed.  HENT:     Head: Normocephalic and atraumatic.  Cardiovascular:     Rate and Rhythm: Normal rate and regular rhythm.     Heart sounds: Normal heart sounds.  Pulmonary:     Effort: Pulmonary effort is normal.     Breath sounds: Normal breath sounds.  Skin:    General: Skin is warm and dry.  Neurological:     Mental Status: She is alert and oriented to person, place, and time.  Psychiatric:        Behavior: Behavior normal.      Results for orders placed or performed in visit on 04/28/23  POCT glycosylated hemoglobin (Hb A1C)  Result Value Ref Range   Hemoglobin A1C 5.7 (A) 4.0 - 5.6 %   HbA1c POC (<> result, manual entry)     HbA1c, POC (prediabetic range)     HbA1c, POC (controlled diabetic range)        The ASCVD Risk score (Arnett DK, et al., 2019) failed to calculate for the following reasons:   The 2019 ASCVD risk score is only valid for ages 97 to 64    Assessment & Plan:   Problem List Items Addressed This Visit       Cardiovascular and Mediastinum   Essential  hypertension, benign    At goal based on her age.      Relevant Orders   Lipid Panel w/reflex Direct LDL   COMPLETE METABOLIC PANEL WITH GFR   Urine Microalbumin w/creat. ratio     Endocrine   Impaired fasting glucose - Primary    A1c does look better today its down to 5.7 which is great.  She has been doing her stationary bike twice a week which is fantastic encouraged her to try to get in the third day if she is able to.      Relevant Orders   POCT glycosylated hemoglobin (Hb A1C) (Completed)   Lipid Panel w/reflex Direct LDL   COMPLETE METABOLIC PANEL WITH GFR   Urine Microalbumin w/creat. ratio     Musculoskeletal and Integument   Osteoporosis    Continue Actonel.      Relevant Orders   Lipid Panel w/reflex Direct LDL   COMPLETE METABOLIC PANEL WITH GFR   Urine Microalbumin w/creat. ratio   Degeneration of L4-L5 intervertebral disc    OK to place referral for either chiropractic care or formal physical therapy.  She will let me know.  Relevant Orders   Lipid Panel w/reflex Direct LDL   COMPLETE METABOLIC PANEL WITH GFR   Urine Microalbumin w/creat. 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 ratio     Other   Hyperlipemia    To recheck lipid panel.  She is not currently on a statin.  Not having any cardiovascular symptoms.      Relevant Orders   Lipid Panel w/reflex Direct LDL   COMPLETE METABOLIC PANEL WITH GFR   Urine Microalbumin w/creat. ratio    No follow-ups on file.    Nani Gasser, MD

## 2023-04-28 NOTE — Assessment & Plan Note (Signed)
A1c does look better today its down to 5.7 which is great.  She has been doing her stationary bike twice a week which is fantastic encouraged her to try to get in the third day if she is able to.

## 2023-04-29 LAB — COMPLETE METABOLIC PANEL WITH GFR
AG Ratio: 1.5 (calc) (ref 1.0–2.5)
ALT: 16 U/L (ref 6–29)
AST: 23 U/L (ref 10–35)
Albumin: 4.3 g/dL (ref 3.6–5.1)
Alkaline phosphatase (APISO): 85 U/L (ref 37–153)
BUN/Creatinine Ratio: 13 (calc) (ref 6–22)
BUN: 14 mg/dL (ref 7–25)
CO2: 24 mmol/L (ref 20–32)
Calcium: 10 mg/dL (ref 8.6–10.4)
Chloride: 108 mmol/L (ref 98–110)
Creat: 1.11 mg/dL — ABNORMAL HIGH (ref 0.60–0.95)
Globulin: 2.9 g/dL (calc) (ref 1.9–3.7)
Glucose, Bld: 107 mg/dL — ABNORMAL HIGH (ref 65–99)
Potassium: 3.5 mmol/L (ref 3.5–5.3)
Sodium: 141 mmol/L (ref 135–146)
Total Bilirubin: 0.6 mg/dL (ref 0.2–1.2)
Total Protein: 7.2 g/dL (ref 6.1–8.1)
eGFR: 50 mL/min/{1.73_m2} — ABNORMAL LOW (ref >=60)

## 2023-04-29 LAB — MICROALBUMIN / CREATININE URINE RATIO
Creatinine, Urine: 175 mg/dL (ref 20–275)
Microalb Creat Ratio: 4 mg/g creat (ref <30)
Microalb, Ur: 0.7 mg/dL

## 2023-04-29 LAB — LIPID PANEL W/REFLEX DIRECT LDL
Cholesterol: 224 mg/dL — ABNORMAL HIGH (ref <200)
HDL: 56 mg/dL (ref >=50)
LDL Cholesterol (Calc): 136 mg/dL (calc) — ABNORMAL HIGH
Non-HDL Cholesterol (Calc): 168 mg/dL (calc) — ABNORMAL HIGH (ref <130)
Total CHOL/HDL Ratio: 4 (calc) (ref <5.0)
Triglycerides: 186 mg/dL — ABNORMAL HIGH (ref <150)

## 2023-04-29 NOTE — Progress Notes (Signed)
Hi Mrs. Hallgren, your LDL cholesterol is elevated similar to what it has been in the past it is about really about the same.  Continue to work on those [employer].  Kidney function is up a little bit normal your around 0.9-1.0.  This time it was 1.1.  So I do want to just keep an eye on this and plan to recheck again in about 3 months instead of 6 months.  Excess protein in the urine which is great.  And liver function looks normal.  Would like to get you scheduled for a virtual visit for your Medicare wellness with our Medicare wellness nurse Luis M. Cintron.

## 2023-06-26 ENCOUNTER — Other Ambulatory Visit: Payer: Self-pay | Admitting: Family Medicine

## 2023-07-08 DIAGNOSIS — Z1231 Encounter for screening mammogram for malignant neoplasm of breast: Secondary | ICD-10-CM | POA: Diagnosis not present

## 2023-07-08 LAB — HM MAMMOGRAPHY

## 2023-07-10 ENCOUNTER — Encounter: Payer: Self-pay | Admitting: Family Medicine

## 2023-07-21 ENCOUNTER — Encounter: Payer: Self-pay | Admitting: Family Medicine

## 2023-10-29 ENCOUNTER — Encounter: Payer: Self-pay | Admitting: Family Medicine

## 2023-10-29 ENCOUNTER — Ambulatory Visit (INDEPENDENT_AMBULATORY_CARE_PROVIDER_SITE_OTHER): Payer: Medicare Other | Admitting: Family Medicine

## 2023-10-29 VITALS — BP 129/69 | HR 77 | Ht 61.0 in | Wt 131.0 lb

## 2023-10-29 DIAGNOSIS — E782 Mixed hyperlipidemia: Secondary | ICD-10-CM | POA: Diagnosis not present

## 2023-10-29 DIAGNOSIS — R7301 Impaired fasting glucose: Secondary | ICD-10-CM

## 2023-10-29 DIAGNOSIS — I10 Essential (primary) hypertension: Secondary | ICD-10-CM | POA: Diagnosis not present

## 2023-10-29 DIAGNOSIS — R7989 Other specified abnormal findings of blood chemistry: Secondary | ICD-10-CM

## 2023-10-29 DIAGNOSIS — N1831 Chronic kidney disease, stage 3a: Secondary | ICD-10-CM

## 2023-10-29 DIAGNOSIS — M25511 Pain in right shoulder: Secondary | ICD-10-CM

## 2023-10-29 LAB — POCT GLYCOSYLATED HEMOGLOBIN (HGB A1C): Hemoglobin A1C: 5.9 % — AB (ref 4.0–5.6)

## 2023-10-29 NOTE — Assessment & Plan Note (Signed)
Due to recheck renal function. 

## 2023-10-29 NOTE — Assessment & Plan Note (Signed)
 A1c looks great today at 5.9.  Up though a little bit from prior at 5.7 but overall looks good.  Currently diet controlled continue work on healthy diet and regular exercise.

## 2023-10-29 NOTE — Assessment & Plan Note (Signed)
 She is not currently on a statin but wants to see if she has been able to improve her lipid levels.

## 2023-10-29 NOTE — Progress Notes (Signed)
 Established Patient Office Visit  Subjective  Patient ID: Linda Werner, female    DOB: [birth year]  Age: 83 y.o. MRN: [id number]  Chief Complaint  Patient presents with   ifg   Hypertension    HPI  Impaired fasting glucose-no increased thirst or urination. No symptoms consistent with hypoglycemia.  Hypertension- Pt denies chest pain, SOB, dizziness, or heart palpitations.  Taking meds as directed w/o problems.  Denies medication side effects.     Reports that her right shoulder has been bothering her mostly a little bit laterally.  Also a little bit of forearm pain intermittently as well she says been coming and going for about a month she sometimes does not alcohol breath on it and it does seem to help.  She does not remember specific injury or trauma but she is pretty active.  She does have to position her arm in a certain way to rest.  She has not noticed any limitation in her range of motion.  She did want a let me know that she is going back to the eye surgeon to have another procedure she had her cataracts done about 2 years ago but it sounds like she is getting laser treatment for them.  She also reports that her back has been flaring a little bit more she has had that on and off for quite some time.  She is also been using an alcohol type rub on that as well and feels like it has been helping but just wanted me to know about it.     ROS    Objective:     BP 129/69   Pulse 77   Ht 5' 1 (1.549 m)   Wt 131 lb (59.4 kg)   SpO2 99%   BMI 24.75 kg/m    Physical Exam Vitals and nursing note reviewed.  Constitutional:      Appearance: Normal appearance.  HENT:     Head: Normocephalic and atraumatic.  Eyes:     Conjunctiva/sclera: Conjunctivae normal.  Cardiovascular:     Rate and Rhythm: Normal rate and regular rhythm.  Pulmonary:     Effort: Pulmonary effort is normal.     Breath sounds: Normal breath sounds.  Musculoskeletal:     Comments: Right  shoulder with NROM.  Strength is normal.  Neg empty can test.  Nontender over the joint.   Skin:    General: Skin is warm and dry.  Neurological:     Mental Status: She is alert.  Psychiatric:        Mood and Affect: Mood normal.      Results for orders placed or performed in visit on 10/29/23  POCT HgB A1C  Result Value Ref Range   Hemoglobin A1C 5.9 (A) 4.0 - 5.6 %   HbA1c POC (<> result, manual entry)     HbA1c, POC (prediabetic range)     HbA1c, POC (controlled diabetic range)        The ASCVD Risk score (Arnett DK, et al., 2019) failed to calculate for the following reasons:   The 2019 ASCVD risk score is only valid for ages 46 to 62    Assessment & Plan:   Problem List Items Addressed This Visit       Cardiovascular and Mediastinum   Essential hypertension, benign   Well controlled. Continue current regimen. Follow up in  6 mo       Relevant Orders   CBC   CMP14+EGFR   Lipid panel  Endocrine   Impaired fasting glucose - Primary   A1c looks great today at 5.9.  Up though a little bit from prior at 5.7 but overall looks good.  Currently diet controlled continue work on healthy diet and regular exercise.      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      Relevant Orders   CBC   CMP14+EGFR   Lipid panel     Other   Hyperlipemia   She is not currently on a statin but wants to see if she has been able to improve her lipid levels.      Relevant Orders   CBC   CMP14+EGFR   Lipid panel   Other Visit Diagnoses       Acute pain of right shoulder           Right shoulder pain-we discussed doing some home exercises for the next 2 to 3 weeks it sounds like it might be bursitis she actually has great range of motion making it less likely to be a rotator cuff injury.  If not improving  then recommend x-ray and follow-up with Dr. Curtis.  Back pain-she says she has been getting relief with just a alcohol wintergreen type rub.  But I did discuss that if she continues to have more frequent pain or it does not seem like it is easing off to please let us  know.  Return in about 6 months (around 04/27/2024) for Pre-diabetes, Hypertension.    Dorothyann Byars, MD

## 2023-10-29 NOTE — Assessment & Plan Note (Signed)
 Well controlled. Continue current regimen. Follow up in  6 mo

## 2023-10-30 LAB — CMP14+EGFR
ALT: 20 [IU]/L (ref 0–32)
AST: 30 [IU]/L (ref 0–40)
Albumin: 4.5 g/dL (ref 3.8–4.8)
Alkaline Phosphatase: 98 [IU]/L (ref 44–121)
BUN/Creatinine Ratio: 9 — ABNORMAL LOW (ref 12–28)
BUN: 11 mg/dL (ref 8–27)
Bilirubin Total: 0.6 mg/dL (ref 0.0–1.2)
CO2: 22 mmol/L (ref 20–29)
Calcium: 10.1 mg/dL (ref 8.7–10.3)
Chloride: 107 mmol/L — ABNORMAL HIGH (ref 96–106)
Creatinine, Ser: 1.2 mg/dL — ABNORMAL HIGH (ref 0.57–1.00)
Globulin, Total: 2.5 g/dL (ref 1.5–4.5)
Glucose: 109 mg/dL — ABNORMAL HIGH (ref 70–99)
Potassium: 3.4 mmol/L — ABNORMAL LOW (ref 3.5–5.2)
Sodium: 145 mmol/L — ABNORMAL HIGH (ref 134–144)
Total Protein: 7 g/dL (ref 6.0–8.5)
eGFR: 46 mL/min/{1.73_m2} — ABNORMAL LOW (ref >59)

## 2023-10-30 LAB — CBC
Hematocrit: 43.4 % (ref 34.0–46.6)
Hemoglobin: 14.4 g/dL (ref 11.1–15.9)
MCH: 29.8 pg (ref 26.6–33.0)
MCHC: 33.2 g/dL (ref 31.5–35.7)
MCV: 90 fL (ref 79–97)
Platelets: 196 10*3/uL (ref 150–450)
RBC: 4.84 x10E6/uL (ref 3.77–5.28)
RDW: 13.3 % (ref 11.7–15.4)
WBC: 5.4 10*3/uL (ref 3.4–10.8)

## 2023-10-30 LAB — LIPID PANEL
Chol/HDL Ratio: 3.6 {ratio} (ref 0.0–4.4)
Cholesterol, Total: 214 mg/dL — ABNORMAL HIGH (ref 100–199)
HDL: 59 mg/dL (ref >39)
LDL Chol Calc (NIH): 135 mg/dL — ABNORMAL HIGH (ref 0–99)
Triglycerides: 110 mg/dL (ref 0–149)
VLDL Cholesterol Cal: 20 mg/dL (ref 5–40)

## 2023-10-30 NOTE — Progress Notes (Signed)
 Hi Linda Werner, kidney function has trended upward slightly.  A year ago you were at 0.9 and now you are at 1.2 so that is a bit of a change in a short period of time.  I want a recheck your kidney function in 2 weeks.  And I would like to schedule you for an ultrasound of your kidneys if you are okay with doing that.  Cholesterol and LDL are elevated just encourage you to continue to work on healthy food choices and regular exercise.  Your blood count is normal no sign of anemia.

## 2023-10-30 NOTE — Addendum Note (Signed)
 Addended by: Nani Gasser D on: 10/30/2023 02:24 PM   Modules accepted: Orders

## 2023-10-31 ENCOUNTER — Other Ambulatory Visit: Payer: Self-pay | Admitting: *Deleted

## 2023-10-31 ENCOUNTER — Encounter: Payer: Self-pay | Admitting: *Deleted

## 2023-10-31 DIAGNOSIS — Z961 Presence of intraocular lens: Secondary | ICD-10-CM | POA: Diagnosis not present

## 2023-10-31 DIAGNOSIS — H40013 Open angle with borderline findings, low risk, bilateral: Secondary | ICD-10-CM | POA: Diagnosis not present

## 2023-10-31 DIAGNOSIS — R7989 Other specified abnormal findings of blood chemistry: Secondary | ICD-10-CM

## 2023-10-31 DIAGNOSIS — H26493 Other secondary cataract, bilateral: Secondary | ICD-10-CM | POA: Diagnosis not present

## 2023-10-31 DIAGNOSIS — I10 Essential (primary) hypertension: Secondary | ICD-10-CM | POA: Diagnosis not present

## 2023-10-31 DIAGNOSIS — H26491 Other secondary cataract, right eye: Secondary | ICD-10-CM | POA: Diagnosis not present

## 2023-11-05 ENCOUNTER — Other Ambulatory Visit: Payer: Self-pay | Admitting: *Deleted

## 2023-11-05 DIAGNOSIS — N1831 Chronic kidney disease, stage 3a: Secondary | ICD-10-CM

## 2023-11-05 DIAGNOSIS — R7989 Other specified abnormal findings of blood chemistry: Secondary | ICD-10-CM

## 2023-11-10 DIAGNOSIS — H26492 Other secondary cataract, left eye: Secondary | ICD-10-CM | POA: Diagnosis not present

## 2023-11-17 DIAGNOSIS — H04123 Dry eye syndrome of bilateral lacrimal glands: Secondary | ICD-10-CM | POA: Diagnosis not present

## 2023-11-25 ENCOUNTER — Ambulatory Visit: Payer: Medicare Other

## 2023-11-25 ENCOUNTER — Other Ambulatory Visit: Payer: Medicare Other

## 2023-11-25 DIAGNOSIS — R7989 Other specified abnormal findings of blood chemistry: Secondary | ICD-10-CM

## 2023-11-25 DIAGNOSIS — N271 Small kidney, bilateral: Secondary | ICD-10-CM | POA: Diagnosis not present

## 2023-11-26 ENCOUNTER — Encounter: Payer: Self-pay | Admitting: Family Medicine

## 2023-11-26 DIAGNOSIS — K760 Fatty (change of) liver, not elsewhere classified: Secondary | ICD-10-CM | POA: Insufficient documentation

## 2023-11-26 NOTE — Progress Notes (Signed)
 Hi Linda Werner, the ultrasound of your kidneys overall looked okay you do have small kidneys in general compared to the average kidney size.  But they did not see any mass or unusual findings which is very reassuring.  The ureters look good as well they did not see anything that would indicate that there was a backup blood pressure etc.  They did comment that your liver look like it had an increased level of fat content this is also called fatty liver.  Over time fatty liver can cause some damage to the liver as well as its function.  The recommendation is just to continue to work on eating a healthy diet more vegetables and lean proteins, and less processed food and carbs and that typically will reduce the fat content in the liver.

## 2023-12-22 ENCOUNTER — Other Ambulatory Visit: Payer: Self-pay | Admitting: Family Medicine

## 2023-12-22 DIAGNOSIS — I10 Essential (primary) hypertension: Secondary | ICD-10-CM

## 2024-04-28 ENCOUNTER — Encounter: Payer: Self-pay | Admitting: Family Medicine

## 2024-04-28 ENCOUNTER — Ambulatory Visit (INDEPENDENT_AMBULATORY_CARE_PROVIDER_SITE_OTHER): Payer: Medicare Other | Admitting: Family Medicine

## 2024-04-28 VITALS — BP 129/58 | HR 73 | Ht 61.0 in | Wt 126.1 lb

## 2024-04-28 DIAGNOSIS — I10 Essential (primary) hypertension: Secondary | ICD-10-CM

## 2024-04-28 DIAGNOSIS — M545 Low back pain, unspecified: Secondary | ICD-10-CM

## 2024-04-28 DIAGNOSIS — G8929 Other chronic pain: Secondary | ICD-10-CM | POA: Diagnosis not present

## 2024-04-28 DIAGNOSIS — M810 Age-related osteoporosis without current pathological fracture: Secondary | ICD-10-CM

## 2024-04-28 DIAGNOSIS — R7301 Impaired fasting glucose: Secondary | ICD-10-CM

## 2024-04-28 DIAGNOSIS — N1831 Chronic kidney disease, stage 3a: Secondary | ICD-10-CM

## 2024-04-28 LAB — POCT GLYCOSYLATED HEMOGLOBIN (HGB A1C): Hemoglobin A1C: 5.5 % (ref 4.0–5.6)

## 2024-04-28 NOTE — Assessment & Plan Note (Signed)
 A1c looks great today at 5.5.  Continue current regimen.

## 2024-04-28 NOTE — Assessment & Plan Note (Signed)
 Well controlled. Continue current regimen. Follow up in  6 mo

## 2024-04-28 NOTE — Assessment & Plan Note (Signed)
 Currently on Actonel , calcium and vitamin D .  Due for repeat DEXA in the fall.

## 2024-04-28 NOTE — Progress Notes (Signed)
 Established Patient Office Visit  Subjective  Patient ID: Linda Werner, female    DOB: Aug 05, 1941  Age: 83 y.o. MRN: [id number]  Chief Complaint  Patient presents with   ifg    HPI  Hypertension- Pt denies chest pain, SOB, dizziness, or heart palpitations.  Taking meds as directed w/o problems.  Denies medication side effects.  She had previously been experiencing some swelling but says that that has gotten better.  She felt like a lot of it was dietary triggered and so she feels like she is managing it well  Impaired fasting glucose-no increased thirst or urination. No symptoms consistent with hypoglycemia.  She has had a little bit more low back pain particularly on the left.  She says normally it bothers her first thing in the morning but when she gets going it goes away and does not really limit her activities during the day.     ROS    Objective:     BP (!) 129/58   Pulse 73   Ht 5' 1 (1.549 m)   Wt 126 lb 1.3 oz (57.2 kg)   SpO2 98%   BMI 23.82 kg/m    Physical Exam Vitals and nursing note reviewed.  Constitutional:      Appearance: Normal appearance.  HENT:     Head: Normocephalic and atraumatic.  Eyes:     Conjunctiva/sclera: Conjunctivae normal.  Cardiovascular:     Rate and Rhythm: Normal rate and regular rhythm.  Pulmonary:     Effort: Pulmonary effort is normal.     Breath sounds: Normal breath sounds.  Skin:    General: Skin is warm and dry.  Neurological:     Mental Status: She is alert.  Psychiatric:        Mood and Affect: Mood normal.      Results for orders placed or performed in visit on 04/28/24  POCT HgB A1C  Result Value Ref Range   Hemoglobin A1C 5.5 4.0 - 5.6 %   HbA1c POC (<> result, manual entry)     HbA1c, POC (prediabetic range)     HbA1c, POC (controlled diabetic range)        The ASCVD Risk score (Arnett DK, et al., 2019) failed to calculate for the following reasons:   The 2019 ASCVD risk score is only  valid for ages 92 to 33    Assessment & Plan:   Problem List Items Addressed This Visit       Cardiovascular and Mediastinum   Essential hypertension, benign   Well controlled. Continue current regimen. Follow up in  6 mo       Relevant Orders   CMP14+EGFR   Lipid panel   CBC   Urine Microalbumin w/creat. ratio     Endocrine   Impaired fasting glucose - Primary   A1c looks great today at 5.5.  Continue current regimen.      Relevant Orders   POCT HgB A1C (Completed)   CMP14+EGFR   Lipid panel   CBC   Urine Microalbumin w/creat. ratio     Musculoskeletal and Integument   Osteoporosis   Currently on Actonel , calcium and vitamin D .  Due for repeat DEXA in the fall.      Relevant Orders   CMP14+EGFR   Lipid panel   CBC   Urine Microalbumin w/creat. 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 ratio   Other Visit Diagnoses       Chronic left-sided low back pain without sciatica            Due for DEXA in Sept. we will try to get that coordinated with her mammogram that she already has scheduled with [facility] for the fall.  Left low back pain we did discuss that if right now she is managing it is not really impacting her day just some pain and some stiffness first thing in the morning we can keep an eye on it if it gets more progressive or worse or more intense or last longer please let us  know.  And we will workup further at that time.   Return in about 6 months (around 10/29/2024) for Pre-diabetes, Hypertension.    Dorothyann Byars, MD

## 2024-04-29 ENCOUNTER — Ambulatory Visit: Payer: Self-pay | Admitting: Family Medicine

## 2024-04-29 LAB — LIPID PANEL
Chol/HDL Ratio: 4.4 ratio (ref 0.0–4.4)
Cholesterol, Total: 244 mg/dL — ABNORMAL HIGH (ref 100–199)
HDL: 55 mg/dL (ref >39)
LDL Chol Calc (NIH): 162 mg/dL — ABNORMAL HIGH (ref 0–99)
Triglycerides: 151 mg/dL — ABNORMAL HIGH (ref 0–149)
VLDL Cholesterol Cal: 27 mg/dL (ref 5–40)

## 2024-04-29 LAB — CMP14+EGFR
ALT: 17 IU/L (ref 0–32)
AST: 25 IU/L (ref 0–40)
Albumin: 4.5 g/dL (ref 3.7–4.7)
Alkaline Phosphatase: 85 IU/L (ref 44–121)
BUN/Creatinine Ratio: 18 (ref 12–28)
BUN: 20 mg/dL (ref 8–27)
Bilirubin Total: 0.8 mg/dL (ref 0.0–1.2)
CO2: 20 mmol/L (ref 20–29)
Calcium: 9.6 mg/dL (ref 8.7–10.3)
Chloride: 104 mmol/L (ref 96–106)
Creatinine, Ser: 1.09 mg/dL — ABNORMAL HIGH (ref 0.57–1.00)
Globulin, Total: 2.7 g/dL (ref 1.5–4.5)
Glucose: 95 mg/dL (ref 70–99)
Potassium: 3.9 mmol/L (ref 3.5–5.2)
Sodium: 143 mmol/L (ref 134–144)
Total Protein: 7.2 g/dL (ref 6.0–8.5)
eGFR: 51 mL/min/1.73 — ABNORMAL LOW (ref >59)

## 2024-04-29 LAB — CBC
Hematocrit: 44.6 % (ref 34.0–46.6)
Hemoglobin: 14.4 g/dL (ref 11.1–15.9)
MCH: 29.9 pg (ref 26.6–33.0)
MCHC: 32.3 g/dL (ref 31.5–35.7)
MCV: 93 fL (ref 79–97)
Platelets: 224 x10E3/uL (ref 150–450)
RBC: 4.81 x10E6/uL (ref 3.77–5.28)
RDW: 12.8 % (ref 11.7–15.4)
WBC: 6.4 x10E3/uL (ref 3.4–10.8)

## 2024-04-29 LAB — MICROALBUMIN / CREATININE URINE RATIO
Creatinine, Urine: 140.3 mg/dL
Microalb/Creat Ratio: 5 mg/g{creat} (ref 0–29)
Microalbumin, Urine: 7.6 ug/mL

## 2024-04-29 NOTE — Progress Notes (Signed)
 Hi Ms. Sonnenfeld, kidney function is stable at 1.0 in fact it looks better than it did 6 months ago so that is good electrolytes are back into the normal range which is great.  Liver function is normal.  Cholesterol jumped up quite a bit.  It went up by almost 30 points.  Just 1 to make sure that you are continuing to work on those healthy foods.  Your blood count looks great.  Urine test still pending.

## 2024-04-30 NOTE — Progress Notes (Signed)
 Hi [name], no excess protein in the urine which is great.

## 2024-05-11 ENCOUNTER — Ambulatory Visit

## 2024-05-23 ENCOUNTER — Other Ambulatory Visit: Payer: Self-pay | Admitting: Family Medicine

## 2024-05-27 ENCOUNTER — Ambulatory Visit

## 2024-05-27 VITALS — Ht 61.0 in | Wt 128.0 lb

## 2024-05-27 DIAGNOSIS — Z0000 Encounter for general adult medical examination without abnormal findings: Secondary | ICD-10-CM

## 2024-05-27 NOTE — Patient Instructions (Signed)
  Linda Werner , Thank you for taking time to come for your Medicare Wellness Visit. I appreciate your ongoing commitment to your health goals. Please review the following plan we discussed and let me know if I can assist you in the future.   These are the goals we discussed:  Goals      Exercise 3x per week (30 min per time)     Do specific exercises to focus on balance so this does not become a problem.     Patient Stated     Patient would like to continue to stay on a healthy lifestyle.      Patient Stated she was glad to hear of program support. she said she would keep program in mind if she should need this support in the future     Care Coordination Interventions:  Active listening / Reflection utilized  Discussed Care Coordination program support Discussed care needs of client Client said she had some difficulty with balance issues.  Client was appreciative to have information about program support and said she would keep program in mind if she should need this support in the future         This is a list of the screening recommended for you and due dates:  Health Maintenance  Topic Date Due   Zoster (Shingles) Vaccine (1 of 2) Never done   COVID-19 Vaccine (4 - 2024-25 season) 06/22/2023   Flu Shot  05/21/2024   Medicare Annual Wellness Visit  05/27/2025   Mammogram  07/07/2025   DTaP/Tdap/Td vaccine (3 - Td or Tdap) 03/12/2033   Pneumococcal Vaccine for age over 57  Completed   DEXA scan (bone density measurement)  Completed   Hepatitis B Vaccine  Aged Out   HPV Vaccine  Aged Out   Meningitis B Vaccine  Aged Out

## 2024-05-27 NOTE — Progress Notes (Signed)
 Subjective:   Linda Werner is a 83 y.o. female who presents for Medicare Annual (Subsequent) preventive examination.  Visit Complete: Virtual I connected with  Linda Werner on 05/27/24 by a audio enabled telemedicine application and verified that I am speaking with the correct person using two identifiers.  Patient Location: Home  Provider Location: Office/Clinic  I discussed the limitations of evaluation and management by telemedicine. The patient expressed understanding and agreed to proceed.  Vital Signs: Because this visit was a virtual/telehealth visit, some criteria may be missing or patient reported. Any vitals not documented were not able to be obtained and vitals that have been documented are patient reported.  Patient Medicare AWV questionnaire was completed by the patient on n/a; I have confirmed that all information answered by patient is correct and no changes since this date.  Cardiac Risk Factors include: advanced age (>67men, >69 women);hypertension     Objective:    Today's Vitals   05/27/24 0858  Weight: 128 lb (58.1 kg)  Height: 5' 1 (1.549 m)   Body mass index is 24.19 kg/m.     05/27/2024    9:11 AM 11/23/2019    3:42 PM 09/19/2018   10:44 AM 11/29/2016   10:54 AM 11/15/2016   10:36 AM 08/08/2015   12:12 PM  Advanced Directives  Does Patient Have a Medical Advance Directive? No No No  No  No  No   Would patient like information on creating a medical advance directive? No - Patient declined No - Patient declined    No - patient declined information      Data saved with a previous flowsheet row definition    Current Medications (verified) Outpatient Encounter Medications as of 05/27/2024  Medication Sig   amLODipine  (NORVASC ) 10 MG tablet TAKE 1 TABLET BY MOUTH EVERY DAY   calcium carbonate (OS-CAL) 600 MG TABS Take 600 mg by mouth 2 (two) times daily with a meal.   Multiple Vitamins-Minerals (CENTRUM SILVER PO) Take 1 tablet by mouth once.    risedronate  (ACTONEL ) 35 MG tablet TAKE 1 TABLET (35 MG TOTAL) BY MOUTH EVERY 7 (SEVEN) DAYS. WITH WATER ON EMPTY STOMACH, NOTHING BY MOUTH OR LIE DOWN FOR NEXT 30 MINUTES   Vitamin D , Cholecalciferol, 400 UNITS CHEW Chew by mouth.   No facility-administered encounter medications on file as of 05/27/2024.    Allergies (verified) Alendronate sodium, Atorvastatin, Benazepril , Statins, and Verapamil hcl   History: Past Medical History:  Diagnosis Date   Allergy    Anxiety    Arthritis not diagnosed   Cataract    CKD (chronic kidney disease) stage 3, GFR 30-59 ml/min (HCC)    Heart murmur last visit   Hx of adenomatous polyp of colon 12/04/2016   Hypercholesteremia    Hypertension    Neuromuscular disorder (HCC) not diagnosed   Osteoporosis    Past Surgical History:  Procedure Laterality Date   ABDOMINAL HYSTERECTOMY     BREAST BIOPSY  2011   left   COLONOSCOPY     Family History  Problem Relation Age of Onset   Arthritis Sister    Cancer Sister    Early death Daughter    Hypertension Other    Colon cancer Neg Hx    Social History   Socioeconomic History   Marital status: Divorced    Spouse name: Not on file   Number of children: 1   Years of education: 15   Highest education level: Some college, no degree  Occupational History   Occupation: Retired Public house manager  Tobacco Use   Smoking status: Never   Smokeless tobacco: Never  Vaping Use   Vaping status: Never Used  Substance and Sexual Activity   Alcohol use: No   Drug use: No   Sexual activity: Never  Other Topics Concern   Not on file  Social History Narrative   Exercises regularly.  No caffeine. She likes to paint and write.    Social Drivers of Corporate investment banker Strain: Low Risk  (05/27/2024)   Overall Financial Resource Strain (CARDIA)    Difficulty of Paying Living Expenses: Not hard at all  Food Insecurity: No Food Insecurity (05/27/2024)   Hunger Vital Sign    Worried About Running Out of Food in  the Last Year: Never true    Ran Out of Food in the Last Year: Never true  Transportation Needs: No Transportation Needs (05/27/2024)   PRAPARE - Administrator, Civil Service (Medical): No    Lack of Transportation (Non-Medical): No  Physical Activity: Sufficiently Active (05/27/2024)   Exercise Vital Sign    Days of Exercise per Week: 7 days    Minutes of Exercise per Session: 30 min  Stress: No Stress Concern Present (05/27/2024)   [employer] of Occupational Health - Occupational Stress Questionnaire    Feeling of Stress: Not at all  Social Connections: Moderately Isolated (05/27/2024)   Social Connection and Isolation Panel    Frequency of Communication with Friends and Family: More than three times a week    Frequency of Social Gatherings with Friends and Family: More than three times a week    Attends Religious Services: Never    Database administrator or Organizations: Yes    Attends Engineer, structural: More than 4 times per year    Marital Status: Divorced    Tobacco Counseling Counseling given: Not Answered   Clinical Intake:  Pre-visit preparation completed: Yes  Pain : No/denies pain     BMI - recorded: 24.19 Nutritional Status: BMI of 19-24  Normal Nutritional Risks: None Diabetes: No  How often do you need to have someone help you when you read instructions, pamphlets, or other written materials from your doctor or pharmacy?: 1 - Never What is the last grade level you completed in school?: 16  Interpreter Needed?: No      Activities of Daily Living    05/27/2024    9:00 AM  In your present state of health, do you have any difficulty performing the following activities:  Hearing? 0  Vision? 0  Difficulty concentrating or making decisions? 0  Walking or climbing stairs? 0  Dressing or bathing? 0  Doing errands, shopping? 0  Preparing Food and eating ? N  Using the Toilet? N  In the past six months, have you accidently leaked  urine? N  Do you have problems with loss of bowel control? N  Managing your Medications? N  Managing your Finances? N  Housekeeping or managing your Housekeeping? N    Patient Care Team: Alvan Linda BIRCH, MD as PCP - General (Family Medicine) Austin Olam CROME, MD as Consulting Physician  Indicate any recent Medical Services you may have received from other than [facility] providers in the past year (date may be approximate).     Assessment:   This is a routine wellness examination for Linda Werner.  Hearing/Vision screen No results found.   Goals Addressed  This Visit's Progress    Patient Stated       Patient would like to continue to stay on a healthy lifestyle.        Depression Screen    05/27/2024    9:09 AM 04/28/2024   10:17 AM 04/28/2023   10:35 AM 10/24/2022    3:11 PM 02/12/2022   10:18 AM 08/16/2021   11:28 AM 02/13/2021    3:32 PM  PHQ 2/9 Scores  PHQ - 2 Score 0 1 0 0 0 0 0    Fall Risk    05/27/2024    9:11 AM 04/28/2024   10:17 AM 04/28/2023   10:35 AM 10/24/2022    3:11 PM 02/12/2022   10:17 AM  Fall Risk   Falls in the past year? 0 0 0 0 0  Number falls in past yr: 0 0 0 0 0  Injury with Fall? 0 0 0 0 0  Risk for fall due to : No Fall Risks No Fall Risks No Fall Risks  No Fall Risks  Follow up Falls evaluation completed Falls evaluation completed Falls evaluation completed Falls evaluation completed  Falls prevention discussed;Falls evaluation completed      Data saved with a previous flowsheet row definition    MEDICARE RISK AT HOME: Medicare Risk at Home Any stairs in or around the home?: No Home free of loose throw rugs in walkways, pet beds, electrical cords, etc?: Yes Adequate lighting in your home to reduce risk of falls?: Yes Life alert?: No Use of a cane, walker or w/c?: No Grab bars in the bathroom?: Yes Shower chair or bench in shower?: No Elevated toilet seat or a handicapped toilet?: Yes  TIMED UP AND GO:  Was the test  performed?  No    Cognitive Function:        05/27/2024    9:12 AM 11/23/2019    3:46 PM  6CIT Screen  What Year? 0 points 0 points  What month? 0 points 3 points  What time? 0 points 3 points  Count back from 20 0 points 0 points  Months in reverse 0 points 0 points  Repeat phrase 0 points 0 points  Total Score 0 points 6 points    Immunizations Immunization History  Administered Date(s) Administered   Fluad Quad(high Dose 65+) 07/28/2019, 08/16/2021, 10/24/2022   Influenza Split 09/02/2012   Influenza Whole 09/01/2008   Influenza, High Dose Seasonal PF 06/18/2018   Influenza,inj,Quad PF,6+ Mos 09/09/2013, 09/09/2014, 12/21/2015, 06/07/2016, 08/25/2017   PFIZER(Purple Top)SARS-COV-2 Vaccination 12/04/2019, 12/27/2019, 03/21/2020   Pneumococcal Conjugate-13 12/09/2016   Pneumococcal Polysaccharide-23 10/21/2008   Tdap 12/28/2012, 03/13/2023    TDAP status: Up to date  Flu Vaccine status: Up to date  Pneumococcal vaccine status: Up to date  Covid-19 vaccine status: Declined, Education has been provided regarding the importance of this vaccine but patient still declined. Advised may receive this vaccine at local pharmacy or Health Dept.or vaccine clinic. Aware to provide a copy of the vaccination record if obtained from local pharmacy or Health Dept. Verbalized acceptance and understanding.  Qualifies for Shingles Vaccine? Yes   Zostavax completed No   Shingrix Completed?: No.    Education has been provided regarding the importance of this vaccine. Patient has been advised to call insurance company to determine out of pocket expense if they have not yet received this vaccine. Advised may also receive vaccine at local pharmacy or Health Dept. Verbalized acceptance and understanding.  Screening Tests Health Maintenance  Topic Date Due   Zoster Vaccines- Shingrix (1 of 2) Never done   COVID-19 Vaccine (4 - 2024-25 season) 06/22/2023   INFLUENZA VACCINE  05/21/2024   Medicare  Annual Wellness (AWV)  05/27/2025   MAMMOGRAM  07/07/2025   DTaP/Tdap/Td (3 - Td or Tdap) 03/12/2033   Pneumococcal Vaccine: 50+ Years  Completed   DEXA SCAN  Completed   Hepatitis B Vaccines  Aged Out   HPV VACCINES  Aged Out   Meningococcal B Vaccine  Aged Out    Health Maintenance  Health Maintenance Due  Topic Date Due   Zoster Vaccines- Shingrix (1 of 2) Never done   COVID-19 Vaccine (4 - 2024-25 season) 06/22/2023   INFLUENZA VACCINE  05/21/2024    Colorectal cancer screening: No longer required.   Mammogram status: Completed 07/08/2023. Repeat every year  Bone Density status: Completed 07/02/2022. Results reflect: Bone density results: OSTEOPOROSIS. Repeat every 2 years.  Lung Cancer Screening: (Low Dose CT Chest recommended if Age 37-80 years, 20 pack-year currently smoking OR have quit w/in 15years.) does not qualify.   Lung Cancer Screening Referral: n/a  Additional Screening:  Hepatitis C Screening: does not qualify; Completed   Vision Screening: Recommended annual ophthalmology exams for early detection of glaucoma and other disorders of the eye. Is the patient up to date with their annual eye exam?  Yes  Who is the provider or what is the name of the office in which the patient attends annual eye exams? Dr Jama If pt is not established with a provider, would they like to be referred to a provider to establish care? No .   Dental Screening: Recommended annual dental exams for proper oral hygiene   Community Resource Referral / Chronic Care Management: CRR required this visit?  No   CCM required this visit?  No     Plan:     I have personally reviewed and noted the following in the patient's chart:   Medical and social history Use of alcohol, tobacco or illicit drugs  Current medications and supplements including opioid prescriptions. Patient is not currently taking opioid prescriptions. Functional ability and status Nutritional status Physical  activity Advanced directives List of other physicians Hospitalizations, surgeries, and ER visits in previous 12 months. None Vitals Screenings to include cognitive, depression, and falls Referrals and appointments  In addition, I have reviewed and discussed with patient certain preventive protocols, quality metrics, and best practice recommendations. A written personalized care plan for preventive services as well as general preventive health recommendations were provided to patient.     Linda Werner, CMA   05/27/2024   After Visit Summary: (MyChart) Due to this being a telephonic visit, the after visit summary with patients personalized plan was offered to patient via MyChart   Nurse Notes:   Linda Werner is a 83 y.o. female patient of Linda Byars, MD who had a Medicare Annual Wellness Visit today via telephone. She reports that he is socially active and does interact with friends/family regularly. She is moderately physically active. She enjoys painting and writing.

## 2024-07-13 DIAGNOSIS — Z1231 Encounter for screening mammogram for malignant neoplasm of breast: Secondary | ICD-10-CM | POA: Diagnosis not present

## 2024-07-13 LAB — HM MAMMOGRAPHY

## 2024-07-14 ENCOUNTER — Encounter: Payer: Self-pay | Admitting: Family Medicine

## 2024-07-24 ENCOUNTER — Other Ambulatory Visit: Payer: Self-pay | Admitting: Family Medicine

## 2024-07-24 DIAGNOSIS — I10 Essential (primary) hypertension: Secondary | ICD-10-CM

## 2024-10-28 ENCOUNTER — Ambulatory Visit: Admitting: Family Medicine

## 2024-10-28 NOTE — Progress Notes (Unsigned)
" ° °  Established Patient Office Visit  Patient ID: Linda Werner, female    DOB: 03-Oct-1941  Age: 84 y.o. MRN: [id number] PCP: Alvan Dorothyann BIRCH, MD  No chief complaint on file.   Subjective:     HPI  Discussed the use of AI scribe software for clinical note transcription with the patient, who gave verbal consent to proceed.  History of Present Illness    {History (Optional):23778}  ROS    Objective:     There were no vitals taken for this visit. {Vitals History (Optional):23777}  Physical Exam  {PhysExam Abridge (Optional):[id number]} No results found for any visits on 10/28/24.  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    Dorothyann Alvan, MD [facility] at [facility]   "

## 2024-11-25 ENCOUNTER — Ambulatory Visit: Admitting: Family Medicine

## 2025-05-31 ENCOUNTER — Ambulatory Visit
